# Patient Record
Sex: Female | Born: 1945 | Race: White | Hispanic: No | Marital: Married | Smoking: Never smoker
Health system: Southern US, Community
[De-identification: ages and names within clinical notes are randomized; demographics above are authoritative.]

## PROBLEM LIST (undated history)

## (undated) DIAGNOSIS — R059 Cough, unspecified: Secondary | ICD-10-CM

## (undated) DIAGNOSIS — F32A Depression, unspecified: Secondary | ICD-10-CM

## (undated) DIAGNOSIS — IMO0001 Reserved for inherently not codable concepts without codable children: Secondary | ICD-10-CM

## (undated) DIAGNOSIS — J45909 Unspecified asthma, uncomplicated: Secondary | ICD-10-CM

## (undated) DIAGNOSIS — R05 Cough: Secondary | ICD-10-CM

## (undated) DIAGNOSIS — I10 Essential (primary) hypertension: Secondary | ICD-10-CM

## (undated) DIAGNOSIS — R51 Headache: Secondary | ICD-10-CM

## (undated) DIAGNOSIS — E7800 Pure hypercholesterolemia, unspecified: Secondary | ICD-10-CM

## (undated) DIAGNOSIS — C801 Malignant (primary) neoplasm, unspecified: Secondary | ICD-10-CM

## (undated) DIAGNOSIS — G5600 Carpal tunnel syndrome, unspecified upper limb: Secondary | ICD-10-CM

## (undated) DIAGNOSIS — D72819 Decreased white blood cell count, unspecified: Secondary | ICD-10-CM

## (undated) DIAGNOSIS — F329 Major depressive disorder, single episode, unspecified: Secondary | ICD-10-CM

## (undated) DIAGNOSIS — N200 Calculus of kidney: Secondary | ICD-10-CM

## (undated) DIAGNOSIS — R519 Headache, unspecified: Secondary | ICD-10-CM

## (undated) DIAGNOSIS — K219 Gastro-esophageal reflux disease without esophagitis: Secondary | ICD-10-CM

## (undated) DIAGNOSIS — J3089 Other allergic rhinitis: Secondary | ICD-10-CM

## (undated) HISTORY — PX: SHOULDER SURGERY: SHX246

---

## 1989-06-06 HISTORY — PX: KNEE ARTHROSCOPY: SUR90

## 2001-11-04 HISTORY — PX: LESION EXCISION: SHX5167

## 2002-10-05 HISTORY — PX: LAPAROSCOPIC NISSEN FUNDOPLICATION: SHX1932

## 2003-07-25 ENCOUNTER — Other Ambulatory Visit: Payer: Self-pay

## 2004-04-12 ENCOUNTER — Ambulatory Visit: Payer: Self-pay | Admitting: Unknown Physician Specialty

## 2004-11-25 ENCOUNTER — Ambulatory Visit: Payer: Self-pay | Admitting: Internal Medicine

## 2005-04-01 ENCOUNTER — Ambulatory Visit: Payer: Self-pay | Admitting: Gastroenterology

## 2006-01-09 ENCOUNTER — Ambulatory Visit: Payer: Self-pay | Admitting: Internal Medicine

## 2007-01-11 ENCOUNTER — Ambulatory Visit: Payer: Self-pay | Admitting: Internal Medicine

## 2008-01-14 ENCOUNTER — Ambulatory Visit: Payer: Self-pay | Admitting: Internal Medicine

## 2009-01-15 ENCOUNTER — Ambulatory Visit: Payer: Self-pay | Admitting: Internal Medicine

## 2010-01-21 ENCOUNTER — Ambulatory Visit: Payer: Self-pay | Admitting: Internal Medicine

## 2011-02-01 ENCOUNTER — Ambulatory Visit: Payer: Self-pay | Admitting: Internal Medicine

## 2012-02-02 ENCOUNTER — Ambulatory Visit: Payer: Self-pay | Admitting: Internal Medicine

## 2012-08-13 ENCOUNTER — Ambulatory Visit: Payer: Self-pay | Admitting: Internal Medicine

## 2013-02-05 ENCOUNTER — Ambulatory Visit: Payer: Self-pay | Admitting: Internal Medicine

## 2014-02-12 ENCOUNTER — Ambulatory Visit: Payer: Self-pay | Admitting: Internal Medicine

## 2015-01-12 ENCOUNTER — Other Ambulatory Visit: Payer: Self-pay | Admitting: Internal Medicine

## 2015-01-12 DIAGNOSIS — Z1231 Encounter for screening mammogram for malignant neoplasm of breast: Secondary | ICD-10-CM

## 2015-01-27 ENCOUNTER — Ambulatory Visit: Payer: Self-pay

## 2015-02-17 ENCOUNTER — Ambulatory Visit: Payer: Self-pay

## 2015-02-18 ENCOUNTER — Ambulatory Visit
Admission: RE | Admit: 2015-02-18 | Discharge: 2015-02-18 | Disposition: A | Discharge status: Home / Self Care | Payer: Medicare Other | Source: Ambulatory Visit | Attending: Internal Medicine | Admitting: Internal Medicine

## 2015-02-18 DIAGNOSIS — Z1231 Encounter for screening mammogram for malignant neoplasm of breast: Secondary | ICD-10-CM | POA: Diagnosis not present

## 2015-02-18 HISTORY — DX: Malignant (primary) neoplasm, unspecified: C80.1

## 2015-02-27 ENCOUNTER — Encounter: Payer: Self-pay | Admitting: *Deleted

## 2015-02-27 NOTE — Discharge Instructions (Signed)

## 2015-02-27 NOTE — Anesthesia Preprocedure Evaluation (Addendum)
Anesthesia Evaluation  Patient identified by MRN, date of birth, ID band  Airway Mallampati: II  TM Distance: >3 FB Neck ROM: Full    Dental   Pulmonary asthma ,           Cardiovascular hypertension, Pt. on medications      Neuro/Psych    GI/Hepatic GERD  Controlled,  Endo/Other    Renal/GU      Musculoskeletal   Abdominal   Peds  Hematology   Anesthesia Other Findings   Reproductive/Obstetrics                            Anesthesia Physical Anesthesia Plan  ASA: II  Anesthesia Plan: MAC   Post-op Pain Management:    Induction:   Airway Management Planned:   Additional Equipment:   Intra-op Plan:   Post-operative Plan:   Informed Consent: I have reviewed the patients History and Physical, chart, labs and discussed the procedure including the risks, benefits and alternatives for the proposed anesthesia with the patient or authorized representative who has indicated his/her understanding and acceptance.     Plan Discussed with: CRNA  Anesthesia Plan Comments:         Anesthesia Quick Evaluation

## 2015-03-03 ENCOUNTER — Encounter: Payer: Self-pay | Admitting: *Deleted

## 2015-03-03 ENCOUNTER — Ambulatory Visit: Payer: Medicare Other | Admitting: Anesthesiology

## 2015-03-03 ENCOUNTER — Ambulatory Visit
Admission: RE | Admit: 2015-03-03 | Discharge: 2015-03-03 | Disposition: A | Discharge status: Home / Self Care | Payer: Medicare Other | Source: Ambulatory Visit | Attending: Gastroenterology | Admitting: Gastroenterology

## 2015-03-03 ENCOUNTER — Encounter
Admission: RE | Disposition: A | Discharge status: Home / Self Care | Payer: Self-pay | Source: Ambulatory Visit | Attending: Gastroenterology

## 2015-03-03 DIAGNOSIS — Z9889 Other specified postprocedural states: Secondary | ICD-10-CM | POA: Diagnosis not present

## 2015-03-03 DIAGNOSIS — J45909 Unspecified asthma, uncomplicated: Secondary | ICD-10-CM | POA: Diagnosis not present

## 2015-03-03 DIAGNOSIS — Z86010 Personal history of colonic polyps: Secondary | ICD-10-CM | POA: Insufficient documentation

## 2015-03-03 DIAGNOSIS — Z87442 Personal history of urinary calculi: Secondary | ICD-10-CM | POA: Diagnosis not present

## 2015-03-03 DIAGNOSIS — F329 Major depressive disorder, single episode, unspecified: Secondary | ICD-10-CM | POA: Insufficient documentation

## 2015-03-03 DIAGNOSIS — Z8249 Family history of ischemic heart disease and other diseases of the circulatory system: Secondary | ICD-10-CM | POA: Diagnosis not present

## 2015-03-03 DIAGNOSIS — Z79899 Other long term (current) drug therapy: Secondary | ICD-10-CM | POA: Insufficient documentation

## 2015-03-03 DIAGNOSIS — K580 Irritable bowel syndrome with diarrhea: Secondary | ICD-10-CM | POA: Insufficient documentation

## 2015-03-03 DIAGNOSIS — Z820 Family history of epilepsy and other diseases of the nervous system: Secondary | ICD-10-CM | POA: Insufficient documentation

## 2015-03-03 DIAGNOSIS — Z833 Family history of diabetes mellitus: Secondary | ICD-10-CM | POA: Insufficient documentation

## 2015-03-03 DIAGNOSIS — D72819 Decreased white blood cell count, unspecified: Secondary | ICD-10-CM | POA: Insufficient documentation

## 2015-03-03 DIAGNOSIS — R1310 Dysphagia, unspecified: Secondary | ICD-10-CM | POA: Diagnosis present

## 2015-03-03 DIAGNOSIS — Z803 Family history of malignant neoplasm of breast: Secondary | ICD-10-CM | POA: Insufficient documentation

## 2015-03-03 DIAGNOSIS — E780 Pure hypercholesterolemia: Secondary | ICD-10-CM | POA: Diagnosis not present

## 2015-03-03 DIAGNOSIS — I10 Essential (primary) hypertension: Secondary | ICD-10-CM | POA: Insufficient documentation

## 2015-03-03 DIAGNOSIS — K210 Gastro-esophageal reflux disease with esophagitis: Secondary | ICD-10-CM | POA: Diagnosis not present

## 2015-03-03 DIAGNOSIS — Z7951 Long term (current) use of inhaled steroids: Secondary | ICD-10-CM | POA: Insufficient documentation

## 2015-03-03 HISTORY — DX: Cough, unspecified: R05.9

## 2015-03-03 HISTORY — PX: ESOPHAGOGASTRODUODENOSCOPY: SHX5428

## 2015-03-03 HISTORY — PX: COLONOSCOPY: SHX5424

## 2015-03-03 HISTORY — DX: Essential (primary) hypertension: I10

## 2015-03-03 HISTORY — DX: Carpal tunnel syndrome, unspecified upper limb: G56.00

## 2015-03-03 HISTORY — DX: Decreased white blood cell count, unspecified: D72.819

## 2015-03-03 HISTORY — DX: Gastro-esophageal reflux disease without esophagitis: K21.9

## 2015-03-03 HISTORY — DX: Reserved for inherently not codable concepts without codable children: IMO0001

## 2015-03-03 HISTORY — DX: Major depressive disorder, single episode, unspecified: F32.9

## 2015-03-03 HISTORY — DX: Pure hypercholesterolemia, unspecified: E78.00

## 2015-03-03 HISTORY — DX: Unspecified asthma, uncomplicated: J45.909

## 2015-03-03 HISTORY — DX: Other allergic rhinitis: J30.89

## 2015-03-03 HISTORY — DX: Calculus of kidney: N20.0

## 2015-03-03 HISTORY — DX: Headache: R51

## 2015-03-03 HISTORY — DX: Depression, unspecified: F32.A

## 2015-03-03 HISTORY — DX: Cough: R05

## 2015-03-03 HISTORY — DX: Headache, unspecified: R51.9

## 2015-03-03 SURGERY — COLONOSCOPY
Anesthesia: Monitor Anesthesia Care | Wound class: Contaminated

## 2015-03-03 MED ORDER — GLYCOPYRROLATE 0.2 MG/ML IJ SOLN
INTRAMUSCULAR | Status: DC | PRN
  Administered 2015-03-03: 0.2 mg via INTRAVENOUS

## 2015-03-03 MED ORDER — PROPOFOL 10 MG/ML IV BOLUS
INTRAVENOUS | Status: DC | PRN
  Administered 2015-03-03 (×3): 20 mg via INTRAVENOUS
  Administered 2015-03-03: 30 mg via INTRAVENOUS
  Administered 2015-03-03: 50 mg via INTRAVENOUS
  Administered 2015-03-03 (×3): 20 mg via INTRAVENOUS
  Administered 2015-03-03: 30 mg via INTRAVENOUS
  Administered 2015-03-03: 10 mg via INTRAVENOUS
  Administered 2015-03-03 (×3): 20 mg via INTRAVENOUS
  Administered 2015-03-03: 30 mg via INTRAVENOUS

## 2015-03-03 MED ORDER — LIDOCAINE HCL (CARDIAC) 20 MG/ML IV SOLN
INTRAVENOUS | Status: DC | PRN
  Administered 2015-03-03: 50 mg via INTRAVENOUS

## 2015-03-03 MED ORDER — SIMETHICONE 40 MG/0.6ML PO SUSP
ORAL | Status: DC | PRN
  Administered 2015-03-03: 10:00:00

## 2015-03-03 MED ORDER — LACTATED RINGERS IV SOLN
INTRAVENOUS | Status: DC
  Administered 2015-03-03: 09:00:00 via INTRAVENOUS

## 2015-03-03 SURGICAL SUPPLY — 41 items
BALLN DILATOR 10-12 8 (BALLOONS) ×2
BALLN DILATOR 12-15 8 (BALLOONS)
BALLN DILATOR 15-18 8 (BALLOONS)
BALLN DILATOR CRE 0-12 8 (BALLOONS) ×1
BALLN DILATOR ESOPH 8 10 CRE (MISCELLANEOUS) IMPLANT
BALLOON DILATOR 12-15 8 (BALLOONS) IMPLANT
BALLOON DILATOR 15-18 8 (BALLOONS) IMPLANT
BALLOON DILATOR CRE 0-12 8 (BALLOONS) ×1 IMPLANT
BLOCK BITE 60FR ADLT L/F GRN (MISCELLANEOUS) ×3 IMPLANT
CANISTER SUCT 1200ML W/VALVE (MISCELLANEOUS) ×3 IMPLANT
FCP ESCP3.2XJMB 240X2.8X (MISCELLANEOUS)
FORCEPS BIOP RAD 4 LRG CAP 4 (CUTTING FORCEPS) IMPLANT
FORCEPS BIOP RJ4 240 W/NDL (MISCELLANEOUS)
FORCEPS ESCP3.2XJMB 240X2.8X (MISCELLANEOUS) IMPLANT
GOWN CVR UNV OPN BCK APRN NK (MISCELLANEOUS) ×1 IMPLANT
GOWN ISOL THUMB LOOP REG UNIV (MISCELLANEOUS) ×2
GOWN STRL REUS W/ TWL LRG LVL3 (GOWN DISPOSABLE) ×1 IMPLANT
GOWN STRL REUS W/TWL LRG LVL3 (GOWN DISPOSABLE) ×2
HEMOCLIP INSTINCT (CLIP) IMPLANT
INJECTOR VARIJECT VIN23 (MISCELLANEOUS) IMPLANT
KIT CO2 TUBING (TUBING) IMPLANT
KIT DEFENDO VALVE AND CONN (KITS) IMPLANT
KIT ENDO PROCEDURE OLY (KITS) ×3 IMPLANT
LIGATOR MULTIBAND 6SHOOTER MBL (MISCELLANEOUS) IMPLANT
MARKER SPOT ENDO TATTOO 5ML (MISCELLANEOUS) IMPLANT
PAD GROUND ADULT SPLIT (MISCELLANEOUS) IMPLANT
SNARE SHORT THROW 13M SML OVAL (MISCELLANEOUS) IMPLANT
SNARE SHORT THROW 30M LRG OVAL (MISCELLANEOUS) IMPLANT
SPOT EX ENDOSCOPIC TATTOO (MISCELLANEOUS)
SUCTION POLY TRAP 4CHAMBER (MISCELLANEOUS) IMPLANT
SYR INFLATION 60ML (SYRINGE) ×3 IMPLANT
TRAP SUCTION POLY (MISCELLANEOUS) IMPLANT
TUBING CONN 6MMX3.1M (TUBING)
TUBING SUCTION CONN 0.25 STRL (TUBING) IMPLANT
UNDERPAD 30X60 958B10 (PK) (MISCELLANEOUS) IMPLANT
VALVE BIOPSY ENDO (VALVE) IMPLANT
VARIJECT INJECTOR VIN23 (MISCELLANEOUS)
WATER AUXILLARY (MISCELLANEOUS) IMPLANT
WATER STERILE IRR 250ML POUR (IV SOLUTION) ×3 IMPLANT
WATER STERILE IRR 500ML POUR (IV SOLUTION) IMPLANT
WIRE CRE 18-20MM 8CM F G (MISCELLANEOUS) IMPLANT

## 2015-03-03 NOTE — H&P (Signed)
  Date of Initial H&P: 02/05/2015  History reviewed, patient examined, no change in status, stable for surgery.

## 2015-03-03 NOTE — Op Note (Signed)
[facility] Patient Name: Diamond Odonnell Procedure Date: 03/03/2015 9:36 AM MRN: [id number] Account #: [id number] Date of Birth: Sep 26, 1945 Admit Type: Outpatient Age: 69 Room: [facility] OR ROOM 01 Gender: Female Note Status: Finalized Procedure:         Colonoscopy Indications:       Screening for colorectal malignant neoplasm Providers:         Lupita Dawn. Candace Cruise, MD Medicines:         Monitored Anesthesia Care Complications:     No immediate complications. Procedure:         Pre-Anesthesia Assessment:                    - Prior to the procedure, a History and Physical was                     performed, and patient medications, allergies and                     sensitivities were reviewed. The patient's tolerance of                     previous anesthesia was reviewed.                    - The risks and benefits of the procedure and the sedation                     options and risks were discussed with the patient. All                     questions were answered and informed consent was obtained.                    - After reviewing the risks and benefits, the patient was                     deemed in satisfactory condition to undergo the procedure.                    After obtaining informed consent, the colonoscope was                     passed under direct vision. Throughout the procedure, the                     patient's blood pressure, pulse, and oxygen saturations                     were monitored continuously. The Olympus CF H180AL                     colonoscope (S#: [id number]) was introduced through the anus                     and advanced to the the cecum, identified by appendiceal                     orifice and ileocecal valve. The colonoscopy was performed                     without difficulty. The patient tolerated the procedure                     well. Findings:  The colon (entire examined portion) appeared normal. Impression:         - The entire examined colon is normal.                    - No specimens collected. Recommendation:    - Discharge patient to home.                    - Repeat colonoscopy in 10 years for surveillance.                    - The findings and recommendations were discussed with the                     patient. Procedure Code(s): --- Professional ---                    [phone], Colonoscopy, flexible; diagnostic, including                     collection of specimen(s) by brushing or washing, when                     performed (separate procedure) Diagnosis Code(s): --- Professional ---                    Z12.11, Encounter for screening for malignant neoplasm of                     colon CPT copyright 2014 American Medical Association. All rights reserved. The codes documented in this report are preliminary and upon coder review may  be revised to meet current compliance requirements. Hulen Luster, MD 03/03/2015 10:13:37 AM This report has been signed electronically. Number of Addenda: 0 Note Initiated On: 03/03/2015 9:36 AM Scope Withdrawal Time: 0 hours 7 minutes 35 seconds  Total Procedure Duration: 0 hours 12 minutes 6 seconds       [facility]

## 2015-03-03 NOTE — Transfer of Care (Signed)
Immediate Anesthesia Transfer of Care Note  Patient: Diamond Odonnell  Procedure(s) Performed: Procedure(s): COLONOSCOPY (N/A) ESOPHAGOGASTRODUODENOSCOPY (EGD) with dialation (N/A)  Patient Location: PACU  Anesthesia Type: MAC  Level of Consciousness: awake, alert  and patient cooperative  Airway and Oxygen Therapy: Patient Spontanous Breathing and Patient connected to supplemental oxygen  Post-op Assessment: Post-op Vital signs reviewed, Patient's Cardiovascular Status Stable, Respiratory Function Stable, Patent Airway and No signs of Nausea or vomiting  Post-op Vital Signs: Reviewed and stable  Complications: No apparent anesthesia complications

## 2015-03-03 NOTE — Anesthesia Procedure Notes (Signed)
Procedure Name: MAC Performed by: Carlene Bickley Pre-anesthesia Checklist: Patient identified, Emergency Drugs available, Suction available, Timeout performed and Patient being monitored Patient Re-evaluated:Patient Re-evaluated prior to inductionOxygen Delivery Method: Nasal cannula Placement Confirmation: positive ETCO2       

## 2015-03-03 NOTE — Anesthesia Postprocedure Evaluation (Signed)
  Anesthesia Post-op Note  Patient: Diamond Odonnell  Procedure(s) Performed: Procedure(s): COLONOSCOPY (N/A) ESOPHAGOGASTRODUODENOSCOPY (EGD) with dialation (N/A)  Anesthesia type:MAC  Patient location: PACU  Post pain: Pain level controlled  Post assessment: Post-op Vital signs reviewed, Patient's Cardiovascular Status Stable, Respiratory Function Stable, Patent Airway and No signs of Nausea or vomiting  Post vital signs: Reviewed and stable  Last Vitals:  Filed Vitals:   03/03/15 1029  BP: 125/73  Pulse: 97  Temp:   Resp: 15    Level of consciousness: awake, alert  and patient cooperative  Complications: No apparent anesthesia complications

## 2015-03-03 NOTE — Op Note (Signed)
[facility] Patient Name: Diamond Odonnell Procedure Date: 03/03/2015 9:37 AM MRN: [id number] Account #: [id number] Date of Birth: September 10, 1945 Admit Type: Outpatient Age: 69 Room: [facility] OR ROOM 01 Gender: Female Note Status: Finalized Procedure:         Upper GI endoscopy Indications:       Dysphagia Providers:         Lupita Dawn. Candace Cruise, MD Referring MD:      Christena Flake. Raechel Ache, MD (Referring MD) Medicines:         Monitored Anesthesia Care Complications:     No immediate complications. Procedure:         Pre-Anesthesia Assessment:                    - Prior to the procedure, a History and Physical was                     performed, and patient medications, allergies and                     sensitivities were reviewed. The patient's tolerance of                     previous anesthesia was reviewed.                    - The risks and benefits of the procedure and the sedation                     options and risks were discussed with the patient. All                     questions were answered and informed consent was obtained.                    - After reviewing the risks and benefits, the patient was                     deemed in satisfactory condition to undergo the procedure.                    After obtaining informed consent, the endoscope was passed                     under direct vision. Throughout the procedure, the                     patient's blood pressure, pulse, and oxygen saturations                     were monitored continuously. The Olympus GIF H180J                     endoscope (S#: [id number]) was introduced through the mouth,                     and advanced to the second part of duodenum. The upper GI                     endoscopy was accomplished without difficulty. The patient                     tolerated the procedure well. Findings:      LA Grade A (one or more mucosal breaks less than 5  mm, not extending       between tops of 2  mucosal folds) esophagitis was found at the       gastroesophageal junction. A TTS dilator was passed through the scope.       Dilation with a 15-16.5-18 mm x 8 cm CRE balloon (to a maximum balloon       size of 18 mm) dilator was performed.      The exam was otherwise without abnormality.      The exam of the stomach was otherwise normal.      Intact Nissen fundoplication.      The examined duodenum was normal. Impression:        - LA Grade A reflux esophagitis. Dilated.                    - The examination was otherwise normal.                    - Normal examined duodenum.                    - No specimens collected. Recommendation:    - Discharge patient to home.                    - Observe patient's clinical course.                    - Use a proton pump inhibitor PO daily daily. Procedure Code(s): --- Professional ---                    [phone], Esophagogastroduodenoscopy, flexible, transoral;                     with transendoscopic balloon dilation of esophagus (less                     than 30 mm diameter) Diagnosis Code(s): --- Professional ---                    K21.0, Gastro-esophageal reflux disease with esophagitis                    R13.10, Dysphagia, unspecified CPT copyright 2014 American Medical Association. All rights reserved. The codes documented in this report are preliminary and upon coder review may  be revised to meet current compliance requirements. Hulen Luster, MD 03/03/2015 9:57:22 AM This report has been signed electronically. Number of Addenda: 0 Note Initiated On: 03/03/2015 9:37 AM      [facility]

## 2015-12-29 ENCOUNTER — Other Ambulatory Visit: Payer: Self-pay | Admitting: Nurse Practitioner

## 2015-12-29 DIAGNOSIS — K2100 Gastro-esophageal reflux disease with esophagitis, without bleeding: Secondary | ICD-10-CM

## 2016-01-01 ENCOUNTER — Ambulatory Visit
Admission: RE | Admit: 2016-01-01 | Discharge: 2016-01-01 | Disposition: A | Discharge status: Home / Self Care | Payer: Medicare Other | Source: Ambulatory Visit | Attending: Nurse Practitioner | Admitting: Nurse Practitioner

## 2016-01-01 DIAGNOSIS — K582 Mixed irritable bowel syndrome: Secondary | ICD-10-CM | POA: Insufficient documentation

## 2016-01-01 DIAGNOSIS — K449 Diaphragmatic hernia without obstruction or gangrene: Secondary | ICD-10-CM | POA: Insufficient documentation

## 2016-01-01 DIAGNOSIS — K222 Esophageal obstruction: Secondary | ICD-10-CM | POA: Diagnosis not present

## 2016-01-01 DIAGNOSIS — K2100 Gastro-esophageal reflux disease with esophagitis, without bleeding: Secondary | ICD-10-CM

## 2016-01-12 ENCOUNTER — Other Ambulatory Visit: Payer: Self-pay | Admitting: Internal Medicine

## 2016-01-12 DIAGNOSIS — Z1231 Encounter for screening mammogram for malignant neoplasm of breast: Secondary | ICD-10-CM

## 2017-05-23 IMAGING — RF DG UGI W/ SMALL BOWEL
15 of 17 series · 15 of 17 positions shown · non-contrast
Comparison: No recent prior .

ADDENDUM:
On further review the mild narrowing of the distal esophagus is most
likely secondary to a fundoplication defect.

EXAM:
UPPER GI SERIES WITH SMALL BOWEL FOLLOW-THROUGH
FLUOROSCOPY TIME:  Radiation Exposure Index (as provided by the
fluoroscopic device): 84.8 mGy
TECHNIQUE: Combined double contrast and single contrast upper GI series using
effervescent crystals, thick barium, and thin barium. Subsequently,
serial images of the small bowel were obtained including spot views
of the terminal ileum.

[Series 1: t abdomen supine · 0.15mm/px · 1 of 1 slices shown]
[im 1/1]
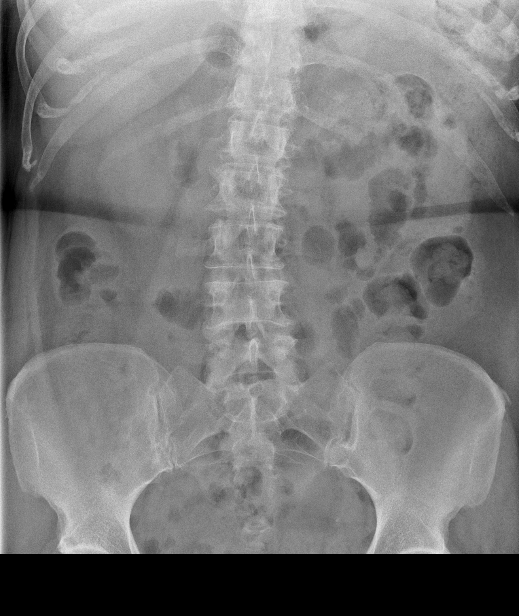

[Series 2: fluoro_barium swallow 2fps_bw · 0.17mm/px · 1 of 1 slices shown (1 of 9)]
[im 1/1]
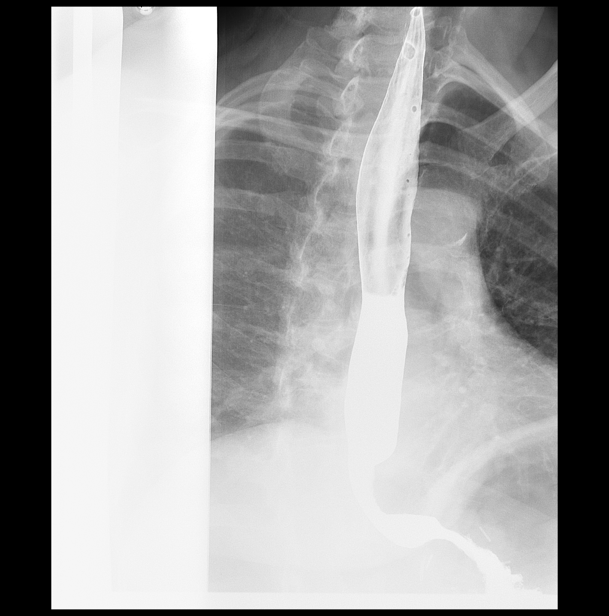

[Series 3: fluoro_barium swallow 2fps_bw · 0.17mm/px · 1 of 1 slices shown (2 of 9)]
[im 1/1]
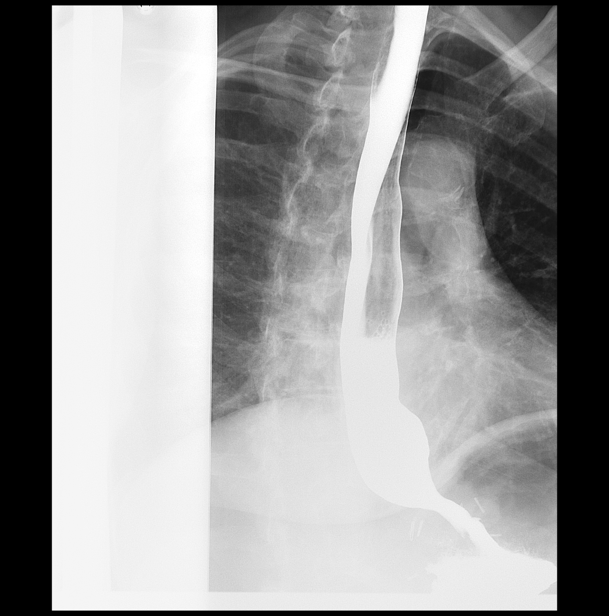

[Series 4: fluoro_barium swallow 2fps_bw · 0.19mm/px · 1 of 1 slices shown (3 of 9)]
[im 1/1]
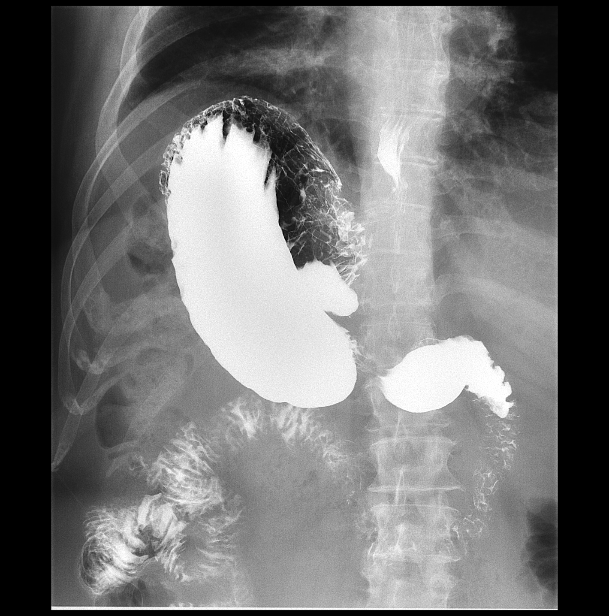

[Series 6: fluoro_barium swallow 2fps_bw · 0.19mm/px · 1 of 1 slices shown (4 of 9)]
[im 1/1]
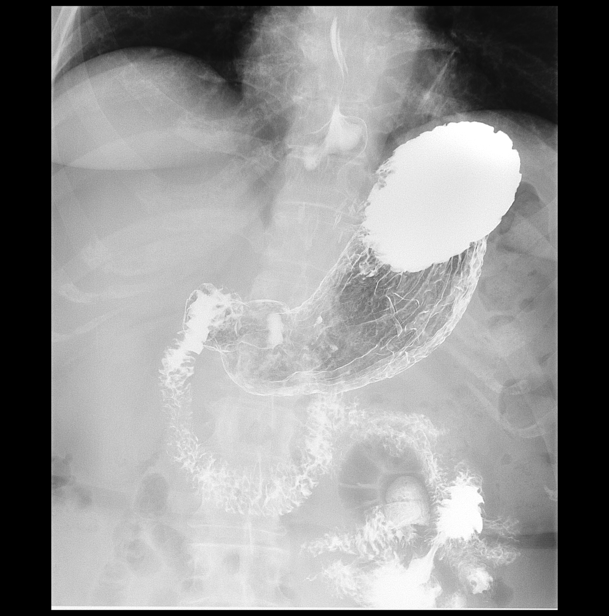

[Series 7: fluoro_barium swallow 2fps_bw · 0.19mm/px · 1 of 1 slices shown (5 of 9)]
[im 1/1]
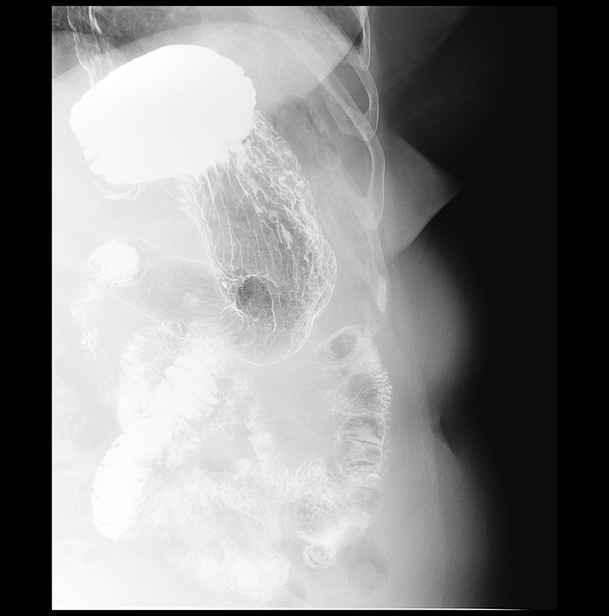

[Series 8: fluoro_barium swallow 2fps_bw · 0.19mm/px · 1 of 1 slices shown (6 of 9)]
[im 1/1]
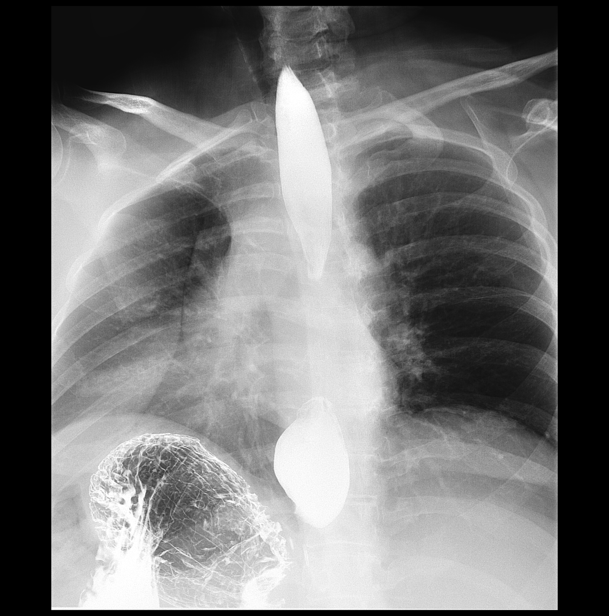

[Series 9: fluoro_barium swallow 2fps_bw · 0.19mm/px · 1 of 1 slices shown (7 of 9)]
[im 1/1]
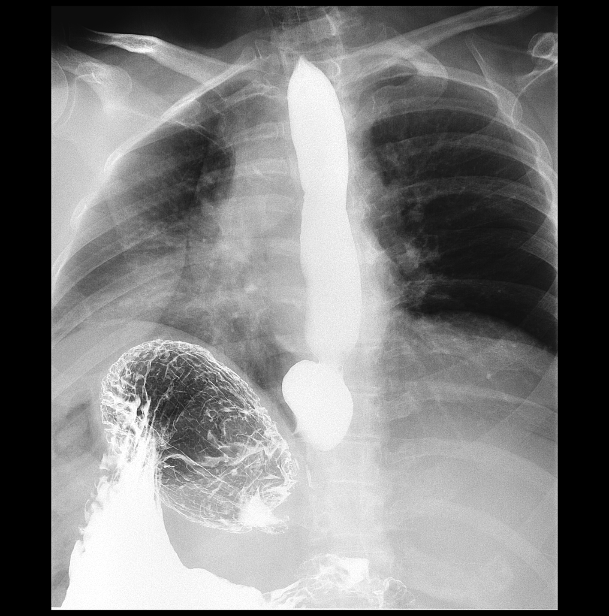

[Series 10: fluoro_barium swallow 2fps_bw · 0.19mm/px · 1 of 1 slices shown (8 of 9)]
[im 1/1]
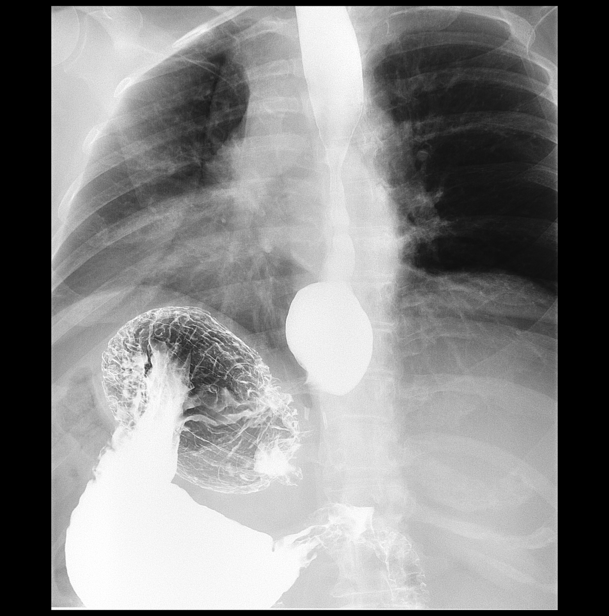

[Series 11: fluoro_barium swallow 2fps_bw · 0.19mm/px · 1 of 1 slices shown (9 of 9)]
[im 1/1]
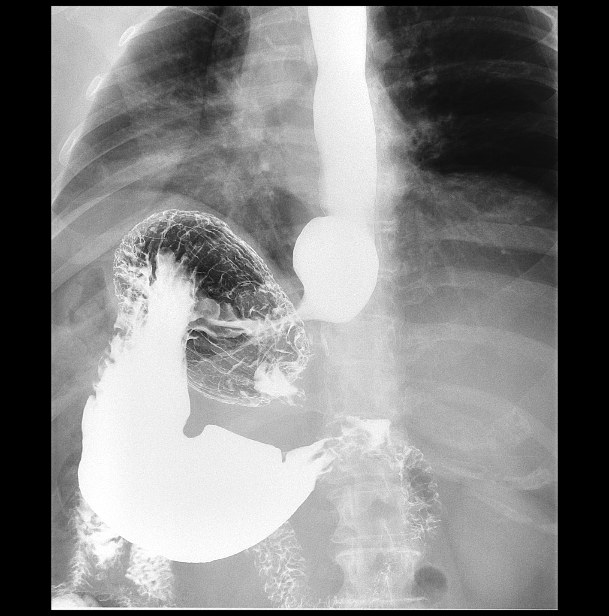

[Series 12: t abdomen barium ap · 0.15mm/px · 1 of 1 slices shown]
[im 1/1]
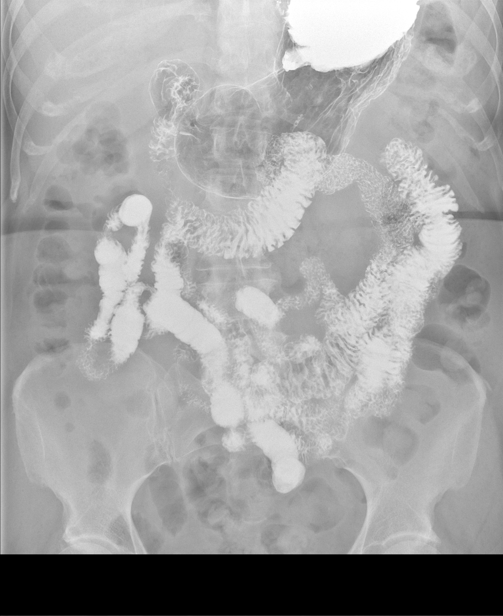

[Series 14: fluoro_barium singleshot_bw · 0.19mm/px · 1 of 1 slices shown (1 of 4)]
[im 1/1]
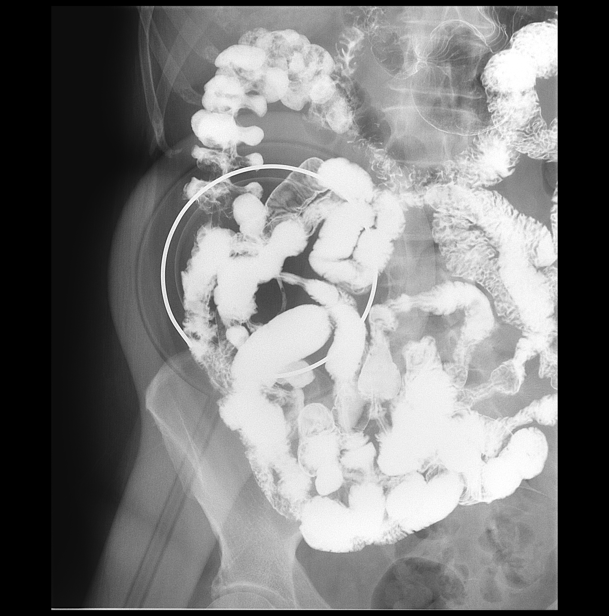

[Series 15: fluoro_barium singleshot_bw · 0.19mm/px · 1 of 1 slices shown (2 of 4)]
[im 1/1]
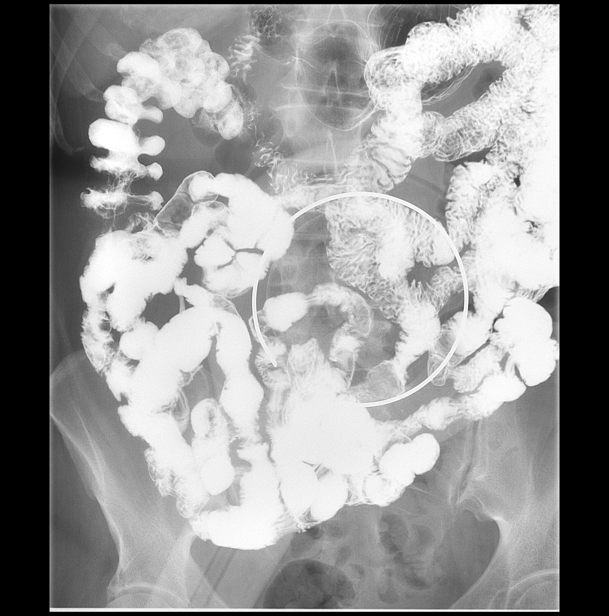

[Series 16: fluoro_barium singleshot_bw · 0.19mm/px · 1 of 1 slices shown (3 of 4)]
[im 1/1]
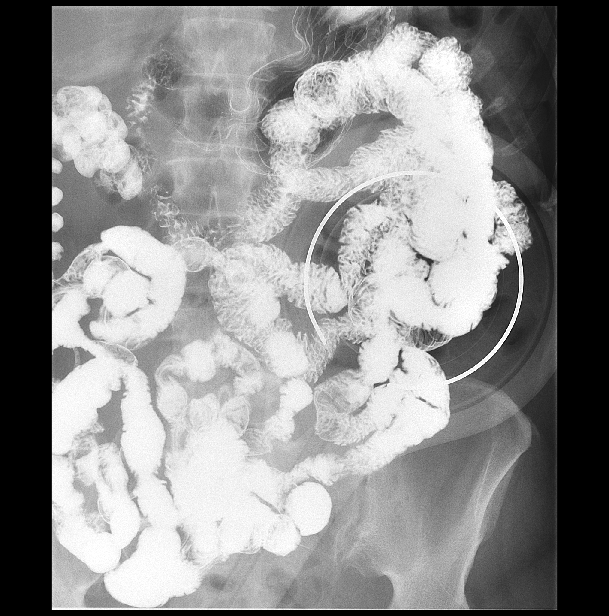

[Series 17: fluoro_barium singleshot_bw · 0.19mm/px · 1 of 1 slices shown (4 of 4)]
[im 1/1]
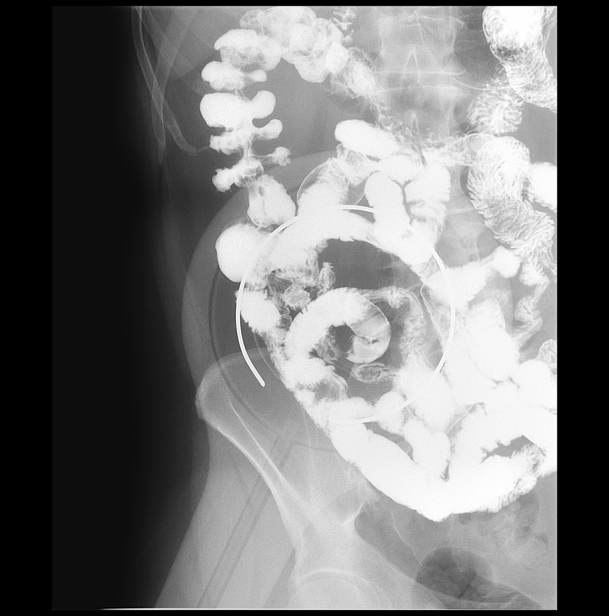

[15 of 17 positions shown; findings below may reference images not displayed]

FINDINGS: Small sliding hiatal hernia is present. Mild narrowing of the distal
esophagus at the gastroesophageal junction is noted. This is
pliable. No mass noted. No obstructing esophageal lesions noted. No
reflux. Stomach and duodenum appear normal. C-loop appears normal.
Small-bowel full-thickness wall. Small-bowel fold caliber normal.
Terminal ileum appears normal.
IMPRESSION: 1. Small sliding hiatal hernia. No prominent reflux. Mild narrowing
of the distal esophagus at the gastroesophageal junction is noted.
This is pliable. This could be secondary to inflammatory change or
prior stricture. No fixed stricture or mass lesion noted. No
obstructing esophageal lesions noted.

2.  Stomach and small bowel appear normal .

## 2017-06-14 ENCOUNTER — Other Ambulatory Visit: Payer: Self-pay | Admitting: Gastroenterology

## 2017-06-14 DIAGNOSIS — R1032 Left lower quadrant pain: Secondary | ICD-10-CM

## 2017-06-14 DIAGNOSIS — R1031 Right lower quadrant pain: Secondary | ICD-10-CM

## 2017-06-14 DIAGNOSIS — R1030 Lower abdominal pain, unspecified: Secondary | ICD-10-CM

## 2017-06-20 ENCOUNTER — Ambulatory Visit
Admission: RE | Admit: 2017-06-20 | Discharge: 2017-06-20 | Disposition: A | Discharge status: Home / Self Care | Payer: Medicare Other | Source: Ambulatory Visit | Attending: Gastroenterology | Admitting: Gastroenterology

## 2017-06-20 DIAGNOSIS — N939 Abnormal uterine and vaginal bleeding, unspecified: Secondary | ICD-10-CM | POA: Insufficient documentation

## 2017-06-20 DIAGNOSIS — R1030 Lower abdominal pain, unspecified: Secondary | ICD-10-CM | POA: Insufficient documentation

## 2017-06-20 DIAGNOSIS — R1031 Right lower quadrant pain: Secondary | ICD-10-CM

## 2017-06-20 DIAGNOSIS — R1032 Left lower quadrant pain: Secondary | ICD-10-CM

## 2017-07-17 ENCOUNTER — Other Ambulatory Visit: Payer: Self-pay | Admitting: Internal Medicine

## 2017-07-17 DIAGNOSIS — Z1231 Encounter for screening mammogram for malignant neoplasm of breast: Secondary | ICD-10-CM

## 2017-07-25 ENCOUNTER — Ambulatory Visit
Admission: RE | Admit: 2017-07-25 | Discharge: 2017-07-25 | Disposition: A | Discharge status: Home / Self Care | Payer: Medicare Other | Source: Ambulatory Visit | Attending: Internal Medicine | Admitting: Internal Medicine

## 2017-07-25 DIAGNOSIS — Z1231 Encounter for screening mammogram for malignant neoplasm of breast: Secondary | ICD-10-CM | POA: Insufficient documentation

## 2017-12-19 ENCOUNTER — Other Ambulatory Visit
Admission: RE | Admit: 2017-12-19 | Discharge: 2017-12-19 | Disposition: A | Discharge status: Home / Self Care | Payer: Medicare Other | Source: Ambulatory Visit | Attending: Gastroenterology | Admitting: Gastroenterology

## 2017-12-19 DIAGNOSIS — K582 Mixed irritable bowel syndrome: Secondary | ICD-10-CM | POA: Diagnosis present

## 2017-12-25 LAB — MISCELLANEOUS TEST

## 2018-04-23 ENCOUNTER — Ambulatory Visit: Payer: Medicare Other | Admitting: Anesthesiology

## 2018-04-23 ENCOUNTER — Ambulatory Visit
Admission: RE | Admit: 2018-04-23 | Discharge: 2018-04-23 | Disposition: A | Discharge status: Home / Self Care | Payer: Medicare Other | Source: Ambulatory Visit | Attending: Gastroenterology | Admitting: Gastroenterology

## 2018-04-23 ENCOUNTER — Encounter: Payer: Self-pay | Admitting: Anesthesiology

## 2018-04-23 ENCOUNTER — Encounter
Admission: RE | Disposition: A | Discharge status: Home / Self Care | Payer: Self-pay | Source: Ambulatory Visit | Attending: Gastroenterology

## 2018-04-23 DIAGNOSIS — F329 Major depressive disorder, single episode, unspecified: Secondary | ICD-10-CM | POA: Insufficient documentation

## 2018-04-23 DIAGNOSIS — K224 Dyskinesia of esophagus: Secondary | ICD-10-CM | POA: Insufficient documentation

## 2018-04-23 DIAGNOSIS — I10 Essential (primary) hypertension: Secondary | ICD-10-CM | POA: Insufficient documentation

## 2018-04-23 DIAGNOSIS — K2970 Gastritis, unspecified, without bleeding: Secondary | ICD-10-CM | POA: Diagnosis not present

## 2018-04-23 DIAGNOSIS — D124 Benign neoplasm of descending colon: Secondary | ICD-10-CM | POA: Insufficient documentation

## 2018-04-23 DIAGNOSIS — J45909 Unspecified asthma, uncomplicated: Secondary | ICD-10-CM | POA: Insufficient documentation

## 2018-04-23 DIAGNOSIS — K319 Disease of stomach and duodenum, unspecified: Secondary | ICD-10-CM | POA: Insufficient documentation

## 2018-04-23 DIAGNOSIS — K529 Noninfective gastroenteritis and colitis, unspecified: Secondary | ICD-10-CM | POA: Diagnosis not present

## 2018-04-23 DIAGNOSIS — E7800 Pure hypercholesterolemia, unspecified: Secondary | ICD-10-CM | POA: Insufficient documentation

## 2018-04-23 DIAGNOSIS — D125 Benign neoplasm of sigmoid colon: Secondary | ICD-10-CM | POA: Insufficient documentation

## 2018-04-23 DIAGNOSIS — K5730 Diverticulosis of large intestine without perforation or abscess without bleeding: Secondary | ICD-10-CM | POA: Insufficient documentation

## 2018-04-23 DIAGNOSIS — K644 Residual hemorrhoidal skin tags: Secondary | ICD-10-CM | POA: Insufficient documentation

## 2018-04-23 DIAGNOSIS — K640 First degree hemorrhoids: Secondary | ICD-10-CM | POA: Diagnosis not present

## 2018-04-23 DIAGNOSIS — K210 Gastro-esophageal reflux disease with esophagitis: Secondary | ICD-10-CM | POA: Diagnosis not present

## 2018-04-23 HISTORY — PX: COLONOSCOPY WITH PROPOFOL: SHX5780

## 2018-04-23 HISTORY — PX: ESOPHAGOGASTRODUODENOSCOPY (EGD) WITH PROPOFOL: SHX5813

## 2018-04-23 SURGERY — ESOPHAGOGASTRODUODENOSCOPY (EGD) WITH PROPOFOL
Anesthesia: General

## 2018-04-23 MED ORDER — MIDAZOLAM HCL 2 MG/2ML IJ SOLN
INTRAMUSCULAR | Status: DC | PRN
  Administered 2018-04-23: 2 mg via INTRAVENOUS

## 2018-04-23 MED ORDER — LIDOCAINE HCL (PF) 2 % IJ SOLN
INTRAMUSCULAR | Status: AC
  Filled 2018-04-23: qty 10

## 2018-04-23 MED ORDER — LIDOCAINE HCL (PF) 1 % IJ SOLN
INTRAMUSCULAR | Status: AC
  Administered 2018-04-23: 0.3 mL via INTRADERMAL
  Filled 2018-04-23: qty 2

## 2018-04-23 MED ORDER — FENTANYL CITRATE (PF) 100 MCG/2ML IJ SOLN
INTRAMUSCULAR | Status: DC | PRN
  Administered 2018-04-23 (×2): 50 ug via INTRAVENOUS

## 2018-04-23 MED ORDER — PROPOFOL 500 MG/50ML IV EMUL
INTRAVENOUS | Status: DC | PRN
  Administered 2018-04-23: 100 ug/kg/min via INTRAVENOUS

## 2018-04-23 MED ORDER — SODIUM CHLORIDE 0.9 % IV SOLN
INTRAVENOUS | Status: DC
  Administered 2018-04-23: 1000 mL via INTRAVENOUS

## 2018-04-23 MED ORDER — MIDAZOLAM HCL 2 MG/2ML IJ SOLN
INTRAMUSCULAR | Status: AC
  Filled 2018-04-23: qty 2

## 2018-04-23 MED ORDER — LIDOCAINE HCL (PF) 1 % IJ SOLN
2.0000 mL | Freq: Once | INTRAMUSCULAR | Status: AC
  Administered 2018-04-23: 0.3 mL via INTRADERMAL

## 2018-04-23 MED ORDER — EPHEDRINE SULFATE 50 MG/ML IJ SOLN
INTRAMUSCULAR | Status: AC
  Filled 2018-04-23: qty 1

## 2018-04-23 MED ORDER — PROPOFOL 500 MG/50ML IV EMUL
INTRAVENOUS | Status: AC
  Filled 2018-04-23: qty 50

## 2018-04-23 MED ORDER — EPHEDRINE SULFATE 50 MG/ML IJ SOLN
INTRAMUSCULAR | Status: DC | PRN
  Administered 2018-04-23 (×2): 5 mg via INTRAVENOUS

## 2018-04-23 MED ORDER — FENTANYL CITRATE (PF) 100 MCG/2ML IJ SOLN
INTRAMUSCULAR | Status: AC
  Filled 2018-04-23: qty 2

## 2018-04-23 MED ORDER — LIDOCAINE HCL (CARDIAC) PF 100 MG/5ML IV SOSY
PREFILLED_SYRINGE | INTRAVENOUS | Status: DC | PRN
  Administered 2018-04-23: 30 mg via INTRAVENOUS

## 2018-04-23 NOTE — Transfer of Care (Signed)
Immediate Anesthesia Transfer of Care Note  Patient: Diamond Odonnell  Procedure(s) Performed: ESOPHAGOGASTRODUODENOSCOPY (EGD) WITH PROPOFOL (N/A ) COLONOSCOPY WITH PROPOFOL (N/A )  Patient Location: PACU  Anesthesia Type:General  Level of Consciousness: awake and sedated  Airway & Oxygen Therapy: Patient Spontanous Breathing and Patient connected to nasal cannula oxygen  Post-op Assessment: Report given to RN and Post -op Vital signs reviewed and stable  Post vital signs: Reviewed and stable  Last Vitals:  Vitals Value Taken Time  BP 132/71 04/23/2018 11:50 AM  Temp 36.4 C 04/23/2018 11:49 AM  Pulse 85 04/23/2018 11:50 AM  Resp 16 04/23/2018 11:50 AM  SpO2 100 % 04/23/2018 11:50 AM  Vitals shown include unvalidated device data.  Last Pain:  Vitals:   04/23/18 1149  TempSrc: Tympanic  PainSc: 0-No pain         Complications: No apparent anesthesia complications

## 2018-04-23 NOTE — Anesthesia Procedure Notes (Addendum)
Performed by: Vaughan Sine Pre-anesthesia Checklist: Patient identified, Suction available, Emergency Drugs available, Patient being monitored and Timeout performed Patient Re-evaluated:Patient Re-evaluated prior to induction Oxygen Delivery Method: Simple face mask Preoxygenation: Pre-oxygenation with 100% oxygen Induction Type: IV induction Ventilation: Oral airway inserted - appropriate to patient size Airway Equipment and Method: Bite block Placement Confirmation: CO2 detector and positive ETCO2

## 2018-04-23 NOTE — Anesthesia Preprocedure Evaluation (Signed)
Anesthesia Evaluation  Patient identified by MRN, date of birth, ID band Patient awake    Reviewed: Allergy & Precautions, NPO status , Patient's Chart, lab work & pertinent test results  History of Anesthesia Complications Negative for: history of anesthetic complications  Airway Mallampati: III  TM Distance: >3 FB Neck ROM: Full    Dental no notable dental hx.    Pulmonary asthma , neg sleep apnea,    breath sounds clear to auscultation- rhonchi (-) wheezing      Cardiovascular hypertension, Pt. on medications (-) CAD, (-) Past MI, (-) Cardiac Stents and (-) CABG  Rhythm:Regular Rate:Normal - Systolic murmurs and - Diastolic murmurs    Neuro/Psych  Headaches, PSYCHIATRIC DISORDERS Depression    GI/Hepatic Neg liver ROS, GERD  ,  Endo/Other  negative endocrine ROSneg diabetes  Renal/GU Renal disease: hx of nephrolithiasis.     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 No date: Cough     Comment:  from asthma and GERD No date: Depression No date: Environmental and seasonal allergies No date: GERD (gastroesophageal reflux disease) No date: Headache     Comment:  from allergies No date: Hypercholesteremia No date: Hypertension No date: Nephrolithiasis No date: Shortness of breath dyspnea     Comment:  when asthma flares   Reproductive/Obstetrics                             Anesthesia Physical Anesthesia Plan  ASA: II  Anesthesia Plan: General   Post-op Pain Management:    Induction: Intravenous  PONV Risk Score and Plan: 2 and Propofol infusion  Airway Management Planned: Natural Airway  Additional  Equipment:   Intra-op Plan:   Post-operative Plan:   Informed Consent: I have reviewed the patients History and Physical, chart, labs and discussed the procedure including the risks, benefits and alternatives for the proposed anesthesia with the patient or authorized representative who has indicated his/her understanding and acceptance.   Dental advisory given  Plan Discussed with: CRNA and Anesthesiologist  Anesthesia Plan Comments:         Anesthesia Quick Evaluation

## 2018-04-23 NOTE — Anesthesia Post-op Follow-up Note (Signed)
Anesthesia QCDR form completed.        

## 2018-04-23 NOTE — Anesthesia Postprocedure Evaluation (Signed)
Anesthesia Post Note  Patient: [name]  Procedure(s) Performed: ESOPHAGOGASTRODUODENOSCOPY (EGD) WITH PROPOFOL (N/A ) COLONOSCOPY WITH PROPOFOL (N/A )  Patient location during evaluation: Endoscopy Anesthesia Type: General Level of consciousness: awake and alert and oriented Pain management: pain level controlled Vital Signs Assessment: post-procedure vital signs reviewed and stable Respiratory status: spontaneous breathing, nonlabored ventilation and respiratory function stable Cardiovascular status: blood pressure returned to baseline and stable Postop Assessment: no signs of nausea or vomiting Anesthetic complications: no     Last Vitals:  Vitals:   04/23/18 1210 04/23/18 1220  BP: (!) 154/85 (!) 154/83  Pulse: 81 76  Resp: 16 17  Temp:    SpO2: 100% 92%    Last Pain:  Vitals:   04/23/18 1220  TempSrc:   PainSc: 0-No pain                 Dakota Stangl

## 2018-04-23 NOTE — Op Note (Signed)
[facility] Patient Name: Diamond Odonnell Procedure Date: 04/23/2018 10:15 AM MRN: [id number] Account #: [id number] Date of Birth: 07-15-1945 Admit Type: Outpatient Age: 72 Room[facility] ENDO ROOM 3 Gender: Female Note Status: Finalized Procedure:            Colonoscopy Indications:          Lower abdominal pain, Chronic diarrhea Providers:            Lollie Sails, MD Referring MD:         Christena Flake. Raechel Ache, MD (Referring MD) Medicines:            Monitored Anesthesia Care Complications:        No immediate complications. Procedure:            Pre-Anesthesia Assessment:                       - ASA Grade Assessment: II - A patient with mild                        systemic disease.                       After obtaining informed consent, the colonoscope was                        passed under direct vision. Throughout the procedure,                        the patient's blood pressure, pulse, and oxygen                        saturations were monitored continuously. The                        Colonoscope was introduced through the anus and                        advanced to the the terminal ileum. The colonoscopy was                        performed with moderate difficulty due to significant                        looping. Successful completion of the procedure was                        aided by changing the patient to a supine position,                        changing the patient to a prone position, using manual                        pressure and withdrawing and reinserting the scope. The                        patient tolerated the procedure well. The quality of                        the bowel preparation was good. Findings:      Multiple small-mouthed diverticula were found in  the sigmoid colon and       distal descending colon.      Two sessile polyps were found in the distal sigmoid colon. The polyps       were 1 to 2 mm in size. These  polyps were removed with a cold biopsy       forceps. Resection and retrieval were complete.      A 2 mm polyp was found in the proximal descending colon. The polyp was       sessile. The polyp was removed with a cold biopsy forceps. Resection and       retrieval were complete.      Biopsies for histology were taken with a cold forceps from the right       colon and left colon for evaluation of microscopic colitis.      Non-bleeding external and internal hemorrhoids were found during       perianal exam and during anoscopy. The hemorrhoids were small and Grade       I (internal hemorrhoids that do not prolapse).      The digital rectal exam was normal otherwise.      The terminal ileum appeared normal. Impression:           - Diverticulosis in the sigmoid colon and in the distal                        descending colon.                       - Two 1 to 2 mm polyps in the distal sigmoid colon,                        removed with a cold biopsy forceps. Resected and                        retrieved.                       - One 2 mm polyp in the proximal descending colon,                        removed with a cold biopsy forceps. Resected and                        retrieved.                       - Non-bleeding external and internal hemorrhoids.                       - Biopsies were taken with a cold forceps from the                        right colon and left colon for evaluation of                        microscopic colitis. Recommendation:       - Discharge patient to home.                       - Await pathology results.                       -  Telephone GI clinic for pathology results in 1 week. Procedure Code(s):    --- Professional ---                       [phone], Colonoscopy, flexible; with biopsy, single or                        multiple Diagnosis Code(s):    --- Professional ---                       K64.0, First degree hemorrhoids                       D12.5, Benign neoplasm  of sigmoid colon                       D12.4, Benign neoplasm of descending colon                       R10.30, Lower abdominal pain, unspecified                       K52.9, Noninfective gastroenteritis and colitis,                        unspecified                       K57.30, Diverticulosis of large intestine without                        perforation or abscess without bleeding CPT copyright 2018 American Medical Association. All rights reserved. The codes documented in this report are preliminary and upon coder review may  be revised to meet current compliance requirements. Lollie Sails, MD 04/23/2018 11:45:42 AM This report has been signed electronically. Number of Addenda: 0 Note Initiated On: 04/23/2018 10:15 AM Scope Withdrawal Time: 0 hours 9 minutes 30 seconds  Total Procedure Duration: 0 hours 30 minutes 31 seconds       [facility]

## 2018-04-23 NOTE — H&P (Signed)
Outpatient short stay form Pre-procedure 04/23/2018 10:29 AM Lollie Sails MD  Primary Physician: Genene Churn, MD  Reason for visit: EGD and colonoscopy  History of present illness: Patient is a 72 year old female presenting today EGD and colonoscopy.  She has a history of some bilateral lower abdominal pain and diarrhea.  Question of a possible abnormal celiac panel.  She also has some dyspepsia and epigastric bloating.  There is a history of fundoplication in 4166.  Has had a chronic intermittent dysphagia since that time.  He has been taking a proton pump inhibitor but not sink with appropriate meal.  Does have some difficulty with burping and may have some amount of gas bloat syndrome.    Current Facility-Administered Medications:  .  0.9 %  sodium chloride infusion, , Intravenous, Continuous, Lollie Sails, MD, Last Rate: 20 mL/hr at 04/23/18 1008, 1,000 mL at 04/23/18 1008  Medications Prior to Admission  Medication Sig Dispense Refill Last Dose  . albuterol (PROVENTIL HFA;VENTOLIN HFA) 108 (90 BASE) MCG/ACT inhaler Inhale into the lungs every 6 (six) hours as needed for wheezing or shortness of breath.   Past Week at Unknown time  . omeprazole (PRILOSEC) 40 MG capsule Take 40 mg by mouth daily.     Marland Kitchen saccharomyces boulardii (FLORASTOR) 250 MG capsule Take 250 mg by mouth daily.     . traZODone (DESYREL) 50 MG tablet Take 50 mg by mouth at bedtime as needed for sleep.     Marland Kitchen atorvastatin (LIPITOR) 10 MG tablet Take 10 mg by mouth daily. AM   03/02/2015 at 0830  . cetirizine (ZYRTEC) 10 MG tablet Take 10 mg by mouth daily. AM   03/02/2015 at 0830  . citalopram (CELEXA) 20 MG tablet Take 20 mg by mouth daily. AM   03/02/2015 at 0830  . clobetasol cream (TEMOVATE) 0.63 % Apply 1 application topically 3 (three) times a week. As needed   Past Week at Unknown time  . cyanocobalamin 500 MCG tablet Take 500 mcg by mouth daily. AM   03/02/2015 at 0830  . Fluticasone-Salmeterol (ADVAIR)  250-50 MCG/DOSE AEPB Inhale 1 puff into the lungs 2 (two) times daily.   03/03/2015 at 0600  . mometasone (NASONEX) 50 MCG/ACT nasal spray Place 2 sprays into the nose daily as needed.   02/28/2015 at Unknown time  . Probiotic Product (PROBIOTIC DAILY PO) Take by mouth. AM   Past Week at Unknown time  . triamterene-hydrochlorothiazide (MAXZIDE-25) 37.5-25 MG per tablet Take 1 tablet by mouth daily. AM   04/23/2018 at 0730     Allergies  Allergen Reactions  . Amitiza [Lubiprostone] Itching     Past Medical History:  Diagnosis Date  . Asthma   . Cancer ([place])    skin ca  . Carpal tunnel syndrome    right  . Chronic leukopenia   . Cough    from asthma and GERD  . Depression   . Environmental and seasonal allergies   . GERD (gastroesophageal reflux disease)   . Headache    from allergies  . Hypercholesteremia   . Hypertension   . Nephrolithiasis   . Shortness of breath dyspnea    when asthma flares    Review of systems:      Physical Exam    Heart and lungs: Regular rate and rhythm without rub or gallop, lungs are bilaterally clear.    HEENT: Normocephalic atraumatic eyes are anicteric    Other:    Pertinant exam for procedure: Soft  nontender nondistended bowel sounds positive normoactive    Planned proceedures: EGD, colonoscopy and indicated procedures. I have discussed the risks benefits and complications of procedures to include not limited to bleeding, infection, perforation and the risk of sedation and the patient wishes to proceed.    Lollie Sails, MD Gastroenterology 04/23/2018  10:29 AM

## 2018-04-23 NOTE — Op Note (Addendum)
[facility]enter[facility] Patient Name: Diamond Odonnell Procedure Date: 04/23/2018 10:16 AM MRN: [id number] Account #: [id number] Date of Birth: 1946/04/27 Admit Type: Outpatient Age: 72 Room: [facility] ENDO ROOM 3 Gender: Female Note Status: Finalized Procedure:            Upper GI endoscopy Indications:          Dyspepsia, Dysphagia, Diarrhea Providers:            Lollie Sails, MD Referring MD:         Christena Flake. Raechel Ache, MD (Referring MD) Medicines:            Monitored Anesthesia Care Complications:        No immediate complications. Procedure:            Pre-Anesthesia Assessment:                       - ASA Grade Assessment: II - A patient with mild                        systemic disease.                       After obtaining informed consent, the endoscope was                        passed under direct vision. Throughout the procedure,                        the patient's blood pressure, pulse, and oxygen                        saturations were monitored continuously. The Endoscope                        was introduced through the mouth, and advanced to the                        third part of duodenum. The upper GI endoscopy was                        accomplished without difficulty. The patient tolerated                        the procedure well. Findings:      Abnormal motility was noted in the middle third of the esophagus and in       the lower third of the esophagus. The cricopharyngeus was normal. There       is a decrease in motility of the esophageal body. Tertiary peristaltic       waves are noted.      The Z-line was irregular. Biopsies were taken with a cold forceps for       histology. Of note, there is no inflammation, ring or web throughout the       esophagus.      Diffuse and patchy minimal inflammation characterized by congestion       (edema) and erythema was found in the gastric body. Biopsies were taken       with a cold forceps for  histology.      Evidence of a Nissen fundoplication was found on retroflexion in the  cardia. The wrap appeared healthy/intact.      The cardia and gastric fundus were normal on retroflexion otherwise.      The examined duodenum was normal. Biopsies were taken with a cold       forceps for histology. Impression:           - Abnormal esophageal motility.                       - Z-line irregular. Biopsied.                       - Bile gastritis. Biopsied.                       - A Nissen fundoplication was found. The wrap appears                        intact.                       - Normal examined duodenum. Biopsied. Recommendation:       - Discharge patient to home.                       - Take prilosec/omeprazole 30--45 minutes before a meal                        as discussed. Procedure Code(s):    --- Professional ---                       [phone], Esophagogastroduodenoscopy, flexible, transoral;                        with biopsy, single or multiple Diagnosis Code(s):    --- Professional ---                       K22.4, Dyskinesia of esophagus                       K22.8, Other specified diseases of esophagus                       K29.60, Other gastritis without bleeding                       Z98.890, Other specified postprocedural states                       R10.13, Epigastric pain                       R13.10, Dysphagia, unspecified                       R19.7, Diarrhea, unspecified CPT copyright 2018 [employer]. All rights reserved. The codes documented in this report are preliminary and upon coder review may  be revised to meet current compliance requirements. Lollie Sails, MD 04/23/2018 11:05:23 AM This report has been signed electronically. Number of Addenda: 0 Note Initiated On: 04/23/2018 10:16 AM      [facility]

## 2018-04-24 LAB — SURGICAL PATHOLOGY

## 2018-04-27 ENCOUNTER — Encounter: Payer: Self-pay | Admitting: Gastroenterology

## 2019-04-22 IMAGING — US US PELVIS COMPLETE TRANSABD/TRANSVAG
1 series · 13 of 25 positions shown · non-contrast
Comparison: None in PACs

CLINICAL DATA: One year history of bilateral lower abdominal pain.
The patient has history of irritable bowel disease. The patient is
postmenopausal.



[Series 1: us pelvis complete transabd/transvag · 0.21mm/px · 13 of 66 slices shown]
[im 1/66]
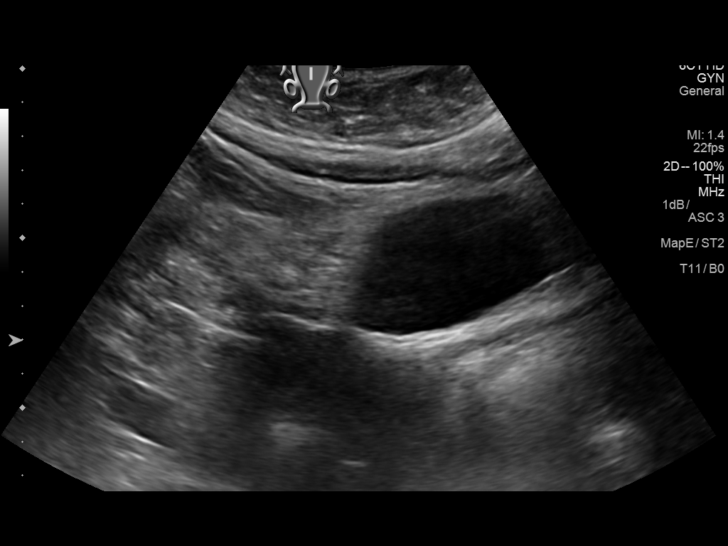
[im 6/66]
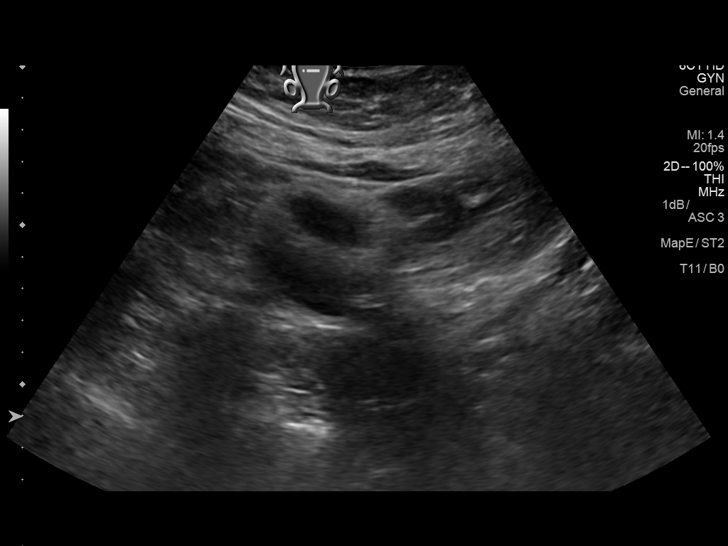
[im 11/66]
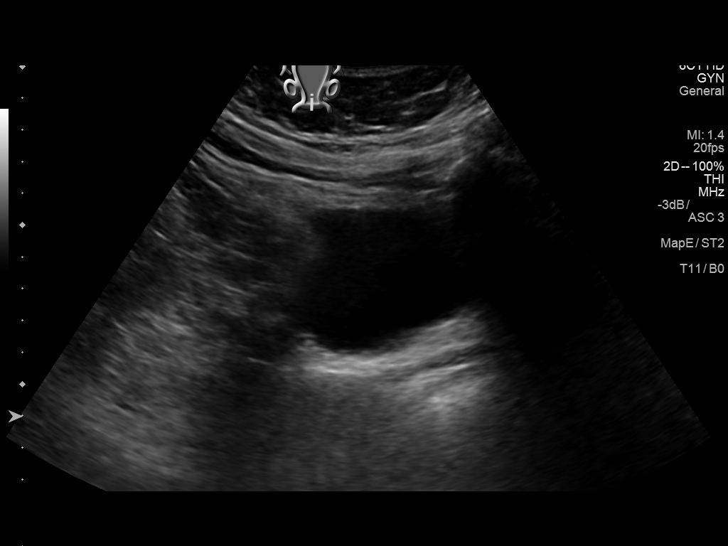
[im 17/66]
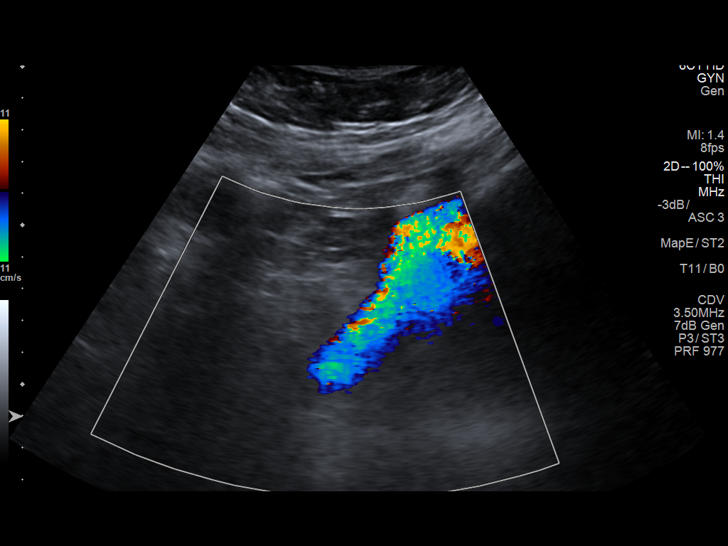
[im 22/66]
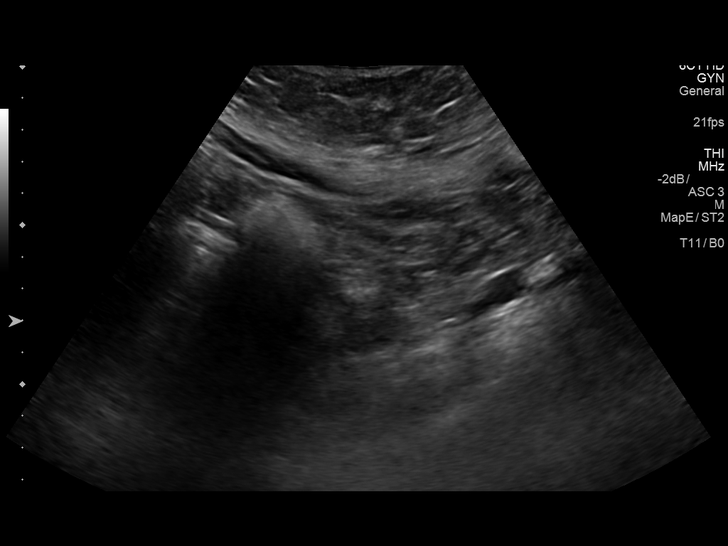
[im 28/66]
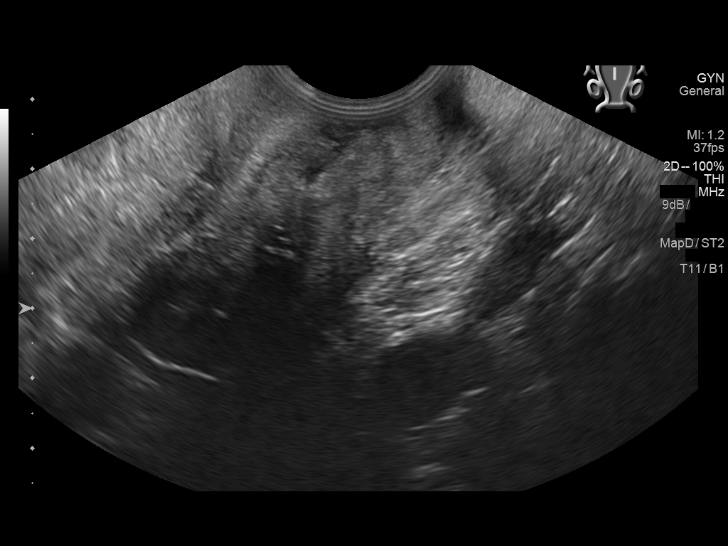
[im 33/66]
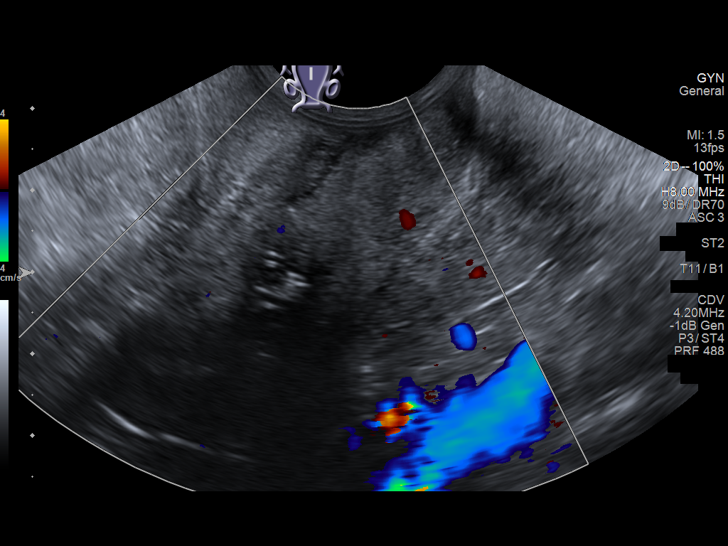
[im 38/66]
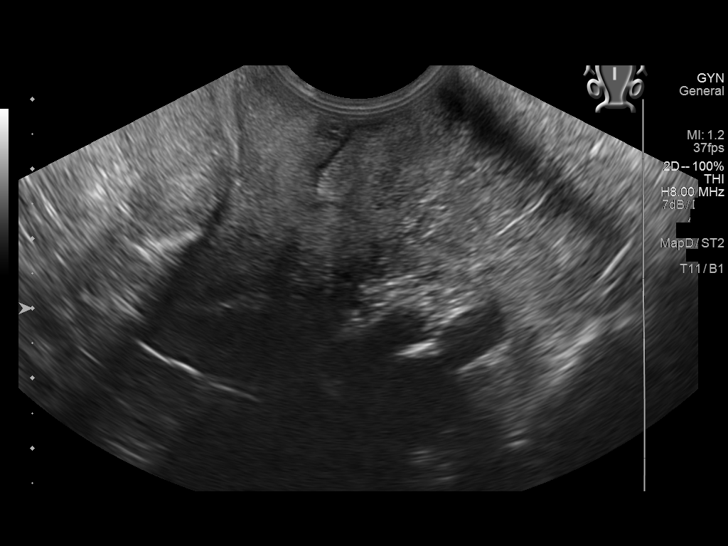
[im 44/66]
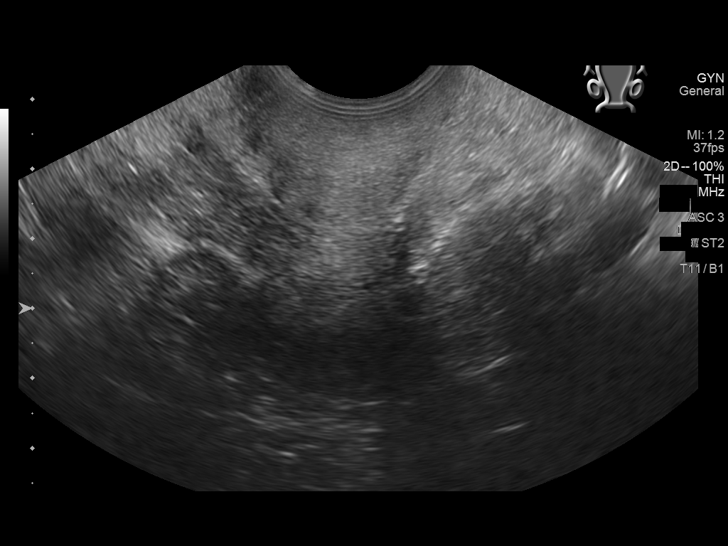
[im 49/66]
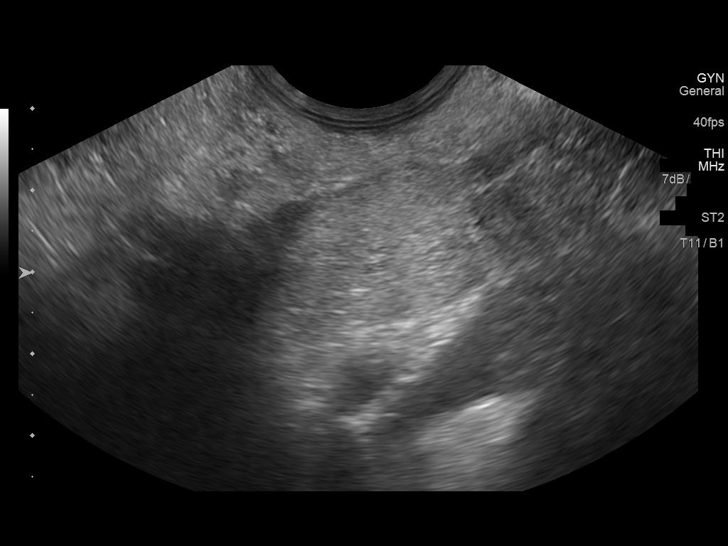
[im 55/66]
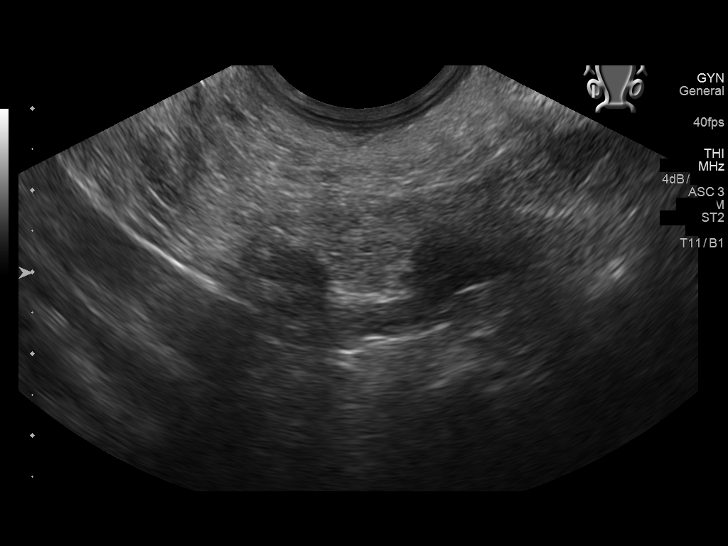
[im 60/66]
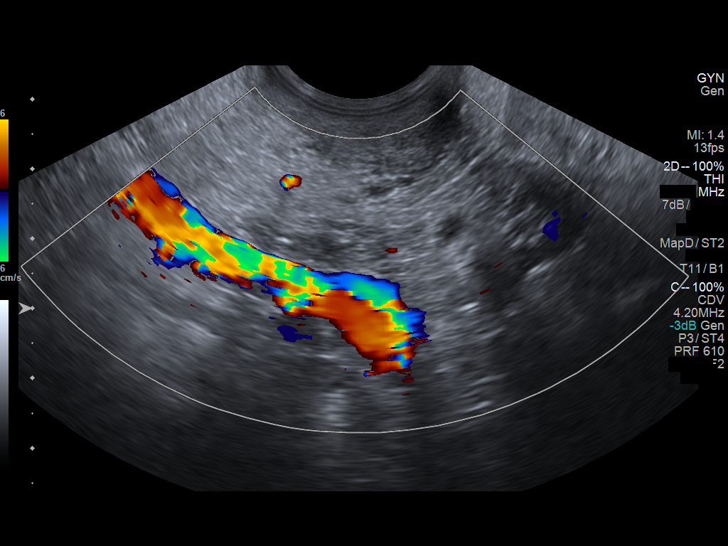
[im 66/66]
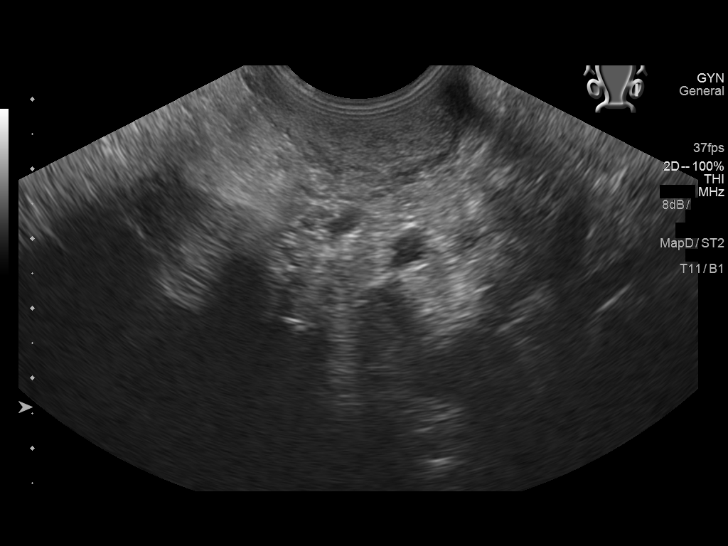

[13 of 25 positions shown; findings below may reference images not displayed]

FINDINGS: Uterus

Measurements: 6.1 x 2.8 x 4.0 cm.. No fibroids are visualized. There
is a 1.0 x 0.8 x 1.3 cm structure in the inferior aspect of the
uterine cavity which may reflect a nabothian cyst. It contain
septations..

Endometrium

Thickness: 2.2 mm.  No focal abnormality visualized.

Right ovary

The right ovary was not visualized.

Left ovary

The left ovary was not visualized.

Other findings

No abnormal free fluid. After the transvaginal portion of the study
slight vaginal bleeding occurred. There was no prior history of
vaginal bleeding. The patient tolerated the transvaginal portion of
the study without complaint and reported no pain after the exam.
IMPRESSION: Probable septated nabothian cyst in the inferior aspect of the
uterus at the junction with the cervix. However, a small amount of
painless vaginal bleeding was observed immediately after the
transvaginal portion exam. While this may be due to atrophic
vaginitis, given the finding in the upper aspect of the cervix, in
the setting of post-menopausal bleeding, endometrial sampling is
indicated to exclude carcinoma. If results are benign,
sonohysterogram should be considered for focal lesion work-up prior
to hysteroscopy. (Ref: Radiological Reasoning: Algorithmic Workup of
Abnormal Vaginal Bleeding with Endovaginal Sonography and
Sonohysterography. AJR [id number]; 191:S68-73)

Nonvisualization of the ovaries. No adnexal masses or free fluid are
observed.

## 2019-08-13 ENCOUNTER — Other Ambulatory Visit: Payer: Self-pay | Admitting: Internal Medicine

## 2019-08-13 DIAGNOSIS — Z1231 Encounter for screening mammogram for malignant neoplasm of breast: Secondary | ICD-10-CM

## 2020-01-30 ENCOUNTER — Ambulatory Visit
Admission: RE | Admit: 2020-01-30 | Discharge: 2020-01-30 | Disposition: A | Discharge status: Home / Self Care | Payer: Medicare PPO | Source: Ambulatory Visit | Attending: Internal Medicine | Admitting: Internal Medicine

## 2020-01-30 ENCOUNTER — Encounter (INDEPENDENT_AMBULATORY_CARE_PROVIDER_SITE_OTHER): Payer: Self-pay

## 2020-01-30 ENCOUNTER — Other Ambulatory Visit: Payer: Self-pay

## 2020-01-30 DIAGNOSIS — Z1231 Encounter for screening mammogram for malignant neoplasm of breast: Secondary | ICD-10-CM | POA: Diagnosis not present

## 2021-10-14 ENCOUNTER — Other Ambulatory Visit: Payer: Self-pay | Admitting: Gerontology

## 2021-10-14 DIAGNOSIS — Z1231 Encounter for screening mammogram for malignant neoplasm of breast: Secondary | ICD-10-CM

## 2022-08-10 DIAGNOSIS — M47896 Other spondylosis, lumbar region: Secondary | ICD-10-CM | POA: Diagnosis not present

## 2022-08-10 DIAGNOSIS — M542 Cervicalgia: Secondary | ICD-10-CM | POA: Diagnosis not present

## 2022-08-10 DIAGNOSIS — M47816 Spondylosis without myelopathy or radiculopathy, lumbar region: Secondary | ICD-10-CM | POA: Diagnosis not present

## 2022-08-10 DIAGNOSIS — M47812 Spondylosis without myelopathy or radiculopathy, cervical region: Secondary | ICD-10-CM | POA: Diagnosis not present

## 2022-08-10 DIAGNOSIS — M545 Low back pain, unspecified: Secondary | ICD-10-CM | POA: Diagnosis not present

## 2022-08-26 DIAGNOSIS — M47812 Spondylosis without myelopathy or radiculopathy, cervical region: Secondary | ICD-10-CM | POA: Diagnosis not present

## 2022-09-16 DIAGNOSIS — M5459 Other low back pain: Secondary | ICD-10-CM | POA: Diagnosis not present

## 2022-09-16 DIAGNOSIS — M542 Cervicalgia: Secondary | ICD-10-CM | POA: Diagnosis not present

## 2022-09-21 DIAGNOSIS — M5459 Other low back pain: Secondary | ICD-10-CM | POA: Diagnosis not present

## 2022-09-21 DIAGNOSIS — M542 Cervicalgia: Secondary | ICD-10-CM | POA: Diagnosis not present

## 2022-10-10 DIAGNOSIS — M5412 Radiculopathy, cervical region: Secondary | ICD-10-CM | POA: Diagnosis not present

## 2022-10-10 DIAGNOSIS — M4712 Other spondylosis with myelopathy, cervical region: Secondary | ICD-10-CM | POA: Diagnosis not present

## 2022-10-11 DIAGNOSIS — M542 Cervicalgia: Secondary | ICD-10-CM | POA: Diagnosis not present

## 2022-10-11 DIAGNOSIS — M5459 Other low back pain: Secondary | ICD-10-CM | POA: Diagnosis not present

## 2022-10-13 DIAGNOSIS — M4712 Other spondylosis with myelopathy, cervical region: Secondary | ICD-10-CM | POA: Diagnosis not present

## 2022-10-20 ENCOUNTER — Other Ambulatory Visit: Payer: Self-pay | Admitting: Gerontology

## 2022-10-20 DIAGNOSIS — E7800 Pure hypercholesterolemia, unspecified: Secondary | ICD-10-CM | POA: Diagnosis not present

## 2022-10-20 DIAGNOSIS — E538 Deficiency of other specified B group vitamins: Secondary | ICD-10-CM | POA: Diagnosis not present

## 2022-10-20 DIAGNOSIS — I129 Hypertensive chronic kidney disease with stage 1 through stage 4 chronic kidney disease, or unspecified chronic kidney disease: Secondary | ICD-10-CM | POA: Diagnosis not present

## 2022-10-20 DIAGNOSIS — F3342 Major depressive disorder, recurrent, in full remission: Secondary | ICD-10-CM | POA: Diagnosis not present

## 2022-10-20 DIAGNOSIS — Z1231 Encounter for screening mammogram for malignant neoplasm of breast: Secondary | ICD-10-CM

## 2022-10-20 DIAGNOSIS — J309 Allergic rhinitis, unspecified: Secondary | ICD-10-CM | POA: Diagnosis not present

## 2022-10-20 DIAGNOSIS — J4540 Moderate persistent asthma, uncomplicated: Secondary | ICD-10-CM | POA: Diagnosis not present

## 2022-10-20 DIAGNOSIS — Z0000 Encounter for general adult medical examination without abnormal findings: Secondary | ICD-10-CM | POA: Diagnosis not present

## 2022-10-20 DIAGNOSIS — F338 Other recurrent depressive disorders: Secondary | ICD-10-CM | POA: Diagnosis not present

## 2022-10-20 DIAGNOSIS — D72819 Decreased white blood cell count, unspecified: Secondary | ICD-10-CM | POA: Diagnosis not present

## 2022-10-20 DIAGNOSIS — N182 Chronic kidney disease, stage 2 (mild): Secondary | ICD-10-CM | POA: Diagnosis not present

## 2022-10-20 DIAGNOSIS — E669 Obesity, unspecified: Secondary | ICD-10-CM | POA: Diagnosis not present

## 2022-10-20 DIAGNOSIS — I10 Essential (primary) hypertension: Secondary | ICD-10-CM | POA: Diagnosis not present

## 2022-10-20 DIAGNOSIS — R7303 Prediabetes: Secondary | ICD-10-CM | POA: Diagnosis not present

## 2022-10-24 DIAGNOSIS — M5412 Radiculopathy, cervical region: Secondary | ICD-10-CM | POA: Diagnosis not present

## 2022-10-28 DIAGNOSIS — M5412 Radiculopathy, cervical region: Secondary | ICD-10-CM | POA: Diagnosis not present

## 2022-11-14 DIAGNOSIS — M47896 Other spondylosis, lumbar region: Secondary | ICD-10-CM | POA: Diagnosis not present

## 2022-11-14 DIAGNOSIS — M5412 Radiculopathy, cervical region: Secondary | ICD-10-CM | POA: Diagnosis not present

## 2022-11-14 DIAGNOSIS — M542 Cervicalgia: Secondary | ICD-10-CM | POA: Diagnosis not present

## 2022-11-17 DIAGNOSIS — M47896 Other spondylosis, lumbar region: Secondary | ICD-10-CM | POA: Diagnosis not present

## 2022-11-23 DIAGNOSIS — M47896 Other spondylosis, lumbar region: Secondary | ICD-10-CM | POA: Diagnosis not present

## 2022-12-02 DIAGNOSIS — M5412 Radiculopathy, cervical region: Secondary | ICD-10-CM | POA: Diagnosis not present

## 2022-12-15 DIAGNOSIS — M47817 Spondylosis without myelopathy or radiculopathy, lumbosacral region: Secondary | ICD-10-CM | POA: Diagnosis not present

## 2022-12-28 DIAGNOSIS — M47817 Spondylosis without myelopathy or radiculopathy, lumbosacral region: Secondary | ICD-10-CM | POA: Diagnosis not present

## 2023-01-11 DIAGNOSIS — M47817 Spondylosis without myelopathy or radiculopathy, lumbosacral region: Secondary | ICD-10-CM | POA: Diagnosis not present

## 2023-01-12 DIAGNOSIS — G894 Chronic pain syndrome: Secondary | ICD-10-CM | POA: Diagnosis not present

## 2023-01-12 DIAGNOSIS — M47817 Spondylosis without myelopathy or radiculopathy, lumbosacral region: Secondary | ICD-10-CM | POA: Diagnosis not present

## 2023-01-25 DIAGNOSIS — M47817 Spondylosis without myelopathy or radiculopathy, lumbosacral region: Secondary | ICD-10-CM | POA: Diagnosis not present

## 2023-02-23 DIAGNOSIS — M47817 Spondylosis without myelopathy or radiculopathy, lumbosacral region: Secondary | ICD-10-CM | POA: Diagnosis not present

## 2023-02-23 DIAGNOSIS — M25562 Pain in left knee: Secondary | ICD-10-CM | POA: Diagnosis not present

## 2023-02-23 DIAGNOSIS — M25561 Pain in right knee: Secondary | ICD-10-CM | POA: Diagnosis not present

## 2023-02-23 DIAGNOSIS — G894 Chronic pain syndrome: Secondary | ICD-10-CM | POA: Diagnosis not present

## 2023-03-08 DIAGNOSIS — K580 Irritable bowel syndrome with diarrhea: Secondary | ICD-10-CM | POA: Diagnosis not present

## 2023-04-04 ENCOUNTER — Ambulatory Visit
Admission: RE | Admit: 2023-04-04 | Discharge: 2023-04-04 | Disposition: A | Discharge status: Home / Self Care | Payer: Medicare PPO | Source: Ambulatory Visit | Attending: Gerontology | Admitting: Gerontology

## 2023-04-04 DIAGNOSIS — Z1231 Encounter for screening mammogram for malignant neoplasm of breast: Secondary | ICD-10-CM | POA: Diagnosis not present

## 2023-04-17 DIAGNOSIS — Z23 Encounter for immunization: Secondary | ICD-10-CM | POA: Diagnosis not present

## 2023-04-17 DIAGNOSIS — K2100 Gastro-esophageal reflux disease with esophagitis, without bleeding: Secondary | ICD-10-CM | POA: Diagnosis not present

## 2023-04-17 DIAGNOSIS — J4540 Moderate persistent asthma, uncomplicated: Secondary | ICD-10-CM | POA: Diagnosis not present

## 2023-04-17 DIAGNOSIS — F338 Other recurrent depressive disorders: Secondary | ICD-10-CM | POA: Diagnosis not present

## 2023-04-17 DIAGNOSIS — Z09 Encounter for follow-up examination after completed treatment for conditions other than malignant neoplasm: Secondary | ICD-10-CM | POA: Diagnosis not present

## 2023-04-17 DIAGNOSIS — F3342 Major depressive disorder, recurrent, in full remission: Secondary | ICD-10-CM | POA: Diagnosis not present

## 2023-04-17 DIAGNOSIS — E7800 Pure hypercholesterolemia, unspecified: Secondary | ICD-10-CM | POA: Diagnosis not present

## 2023-04-17 DIAGNOSIS — E669 Obesity, unspecified: Secondary | ICD-10-CM | POA: Diagnosis not present

## 2023-04-17 DIAGNOSIS — I10 Essential (primary) hypertension: Secondary | ICD-10-CM | POA: Diagnosis not present

## 2023-04-17 DIAGNOSIS — I129 Hypertensive chronic kidney disease with stage 1 through stage 4 chronic kidney disease, or unspecified chronic kidney disease: Secondary | ICD-10-CM | POA: Diagnosis not present

## 2023-04-17 DIAGNOSIS — J309 Allergic rhinitis, unspecified: Secondary | ICD-10-CM | POA: Diagnosis not present

## 2023-05-15 DIAGNOSIS — G4733 Obstructive sleep apnea (adult) (pediatric): Secondary | ICD-10-CM | POA: Diagnosis not present

## 2023-05-23 DIAGNOSIS — G4733 Obstructive sleep apnea (adult) (pediatric): Secondary | ICD-10-CM | POA: Diagnosis not present

## 2023-06-12 DIAGNOSIS — G4733 Obstructive sleep apnea (adult) (pediatric): Secondary | ICD-10-CM | POA: Diagnosis not present

## 2023-08-03 DIAGNOSIS — M25561 Pain in right knee: Secondary | ICD-10-CM | POA: Diagnosis not present

## 2023-08-03 DIAGNOSIS — M25661 Stiffness of right knee, not elsewhere classified: Secondary | ICD-10-CM | POA: Diagnosis not present

## 2023-08-03 DIAGNOSIS — R262 Difficulty in walking, not elsewhere classified: Secondary | ICD-10-CM | POA: Diagnosis not present

## 2023-08-03 DIAGNOSIS — M1711 Unilateral primary osteoarthritis, right knee: Secondary | ICD-10-CM | POA: Diagnosis not present

## 2023-08-10 DIAGNOSIS — M25661 Stiffness of right knee, not elsewhere classified: Secondary | ICD-10-CM | POA: Diagnosis not present

## 2023-08-10 DIAGNOSIS — R262 Difficulty in walking, not elsewhere classified: Secondary | ICD-10-CM | POA: Diagnosis not present

## 2023-08-10 DIAGNOSIS — M25561 Pain in right knee: Secondary | ICD-10-CM | POA: Diagnosis not present

## 2023-08-10 DIAGNOSIS — M1711 Unilateral primary osteoarthritis, right knee: Secondary | ICD-10-CM | POA: Diagnosis not present

## 2023-08-22 DIAGNOSIS — M25561 Pain in right knee: Secondary | ICD-10-CM | POA: Diagnosis not present

## 2023-08-22 DIAGNOSIS — M1711 Unilateral primary osteoarthritis, right knee: Secondary | ICD-10-CM | POA: Diagnosis not present

## 2023-08-22 DIAGNOSIS — M25661 Stiffness of right knee, not elsewhere classified: Secondary | ICD-10-CM | POA: Diagnosis not present

## 2023-08-22 DIAGNOSIS — R262 Difficulty in walking, not elsewhere classified: Secondary | ICD-10-CM | POA: Diagnosis not present

## 2023-08-30 DIAGNOSIS — M25561 Pain in right knee: Secondary | ICD-10-CM | POA: Diagnosis not present

## 2023-08-30 DIAGNOSIS — M25661 Stiffness of right knee, not elsewhere classified: Secondary | ICD-10-CM | POA: Diagnosis not present

## 2023-08-30 DIAGNOSIS — M1711 Unilateral primary osteoarthritis, right knee: Secondary | ICD-10-CM | POA: Diagnosis not present

## 2023-08-30 DIAGNOSIS — R262 Difficulty in walking, not elsewhere classified: Secondary | ICD-10-CM | POA: Diagnosis not present

## 2023-09-06 DIAGNOSIS — R262 Difficulty in walking, not elsewhere classified: Secondary | ICD-10-CM | POA: Diagnosis not present

## 2023-09-06 DIAGNOSIS — M25661 Stiffness of right knee, not elsewhere classified: Secondary | ICD-10-CM | POA: Diagnosis not present

## 2023-09-06 DIAGNOSIS — M1711 Unilateral primary osteoarthritis, right knee: Secondary | ICD-10-CM | POA: Diagnosis not present

## 2023-09-06 DIAGNOSIS — M25561 Pain in right knee: Secondary | ICD-10-CM | POA: Diagnosis not present

## 2023-09-07 DIAGNOSIS — K589 Irritable bowel syndrome without diarrhea: Secondary | ICD-10-CM | POA: Diagnosis not present

## 2023-09-13 DIAGNOSIS — M1711 Unilateral primary osteoarthritis, right knee: Secondary | ICD-10-CM | POA: Diagnosis not present

## 2023-09-13 DIAGNOSIS — M25661 Stiffness of right knee, not elsewhere classified: Secondary | ICD-10-CM | POA: Diagnosis not present

## 2023-09-13 DIAGNOSIS — M25561 Pain in right knee: Secondary | ICD-10-CM | POA: Diagnosis not present

## 2023-09-13 DIAGNOSIS — R262 Difficulty in walking, not elsewhere classified: Secondary | ICD-10-CM | POA: Diagnosis not present

## 2023-09-20 DIAGNOSIS — M1711 Unilateral primary osteoarthritis, right knee: Secondary | ICD-10-CM | POA: Diagnosis not present

## 2023-09-20 DIAGNOSIS — M25561 Pain in right knee: Secondary | ICD-10-CM | POA: Diagnosis not present

## 2023-09-20 DIAGNOSIS — M25661 Stiffness of right knee, not elsewhere classified: Secondary | ICD-10-CM | POA: Diagnosis not present

## 2023-09-20 DIAGNOSIS — R262 Difficulty in walking, not elsewhere classified: Secondary | ICD-10-CM | POA: Diagnosis not present

## 2023-10-18 DIAGNOSIS — E669 Obesity, unspecified: Secondary | ICD-10-CM | POA: Diagnosis not present

## 2023-10-18 DIAGNOSIS — J4540 Moderate persistent asthma, uncomplicated: Secondary | ICD-10-CM | POA: Diagnosis not present

## 2023-10-18 DIAGNOSIS — Z1331 Encounter for screening for depression: Secondary | ICD-10-CM | POA: Diagnosis not present

## 2023-10-18 DIAGNOSIS — E538 Deficiency of other specified B group vitamins: Secondary | ICD-10-CM | POA: Diagnosis not present

## 2023-10-18 DIAGNOSIS — E7800 Pure hypercholesterolemia, unspecified: Secondary | ICD-10-CM | POA: Diagnosis not present

## 2023-10-18 DIAGNOSIS — I10 Essential (primary) hypertension: Secondary | ICD-10-CM | POA: Diagnosis not present

## 2023-10-18 DIAGNOSIS — Z79899 Other long term (current) drug therapy: Secondary | ICD-10-CM | POA: Diagnosis not present

## 2023-10-18 DIAGNOSIS — E66811 Obesity, class 1: Secondary | ICD-10-CM | POA: Diagnosis not present

## 2023-10-18 DIAGNOSIS — N182 Chronic kidney disease, stage 2 (mild): Secondary | ICD-10-CM | POA: Diagnosis not present

## 2023-10-18 DIAGNOSIS — F3342 Major depressive disorder, recurrent, in full remission: Secondary | ICD-10-CM | POA: Diagnosis not present

## 2023-10-18 DIAGNOSIS — J309 Allergic rhinitis, unspecified: Secondary | ICD-10-CM | POA: Diagnosis not present

## 2023-10-18 DIAGNOSIS — R7303 Prediabetes: Secondary | ICD-10-CM | POA: Diagnosis not present

## 2023-10-18 DIAGNOSIS — K580 Irritable bowel syndrome with diarrhea: Secondary | ICD-10-CM | POA: Diagnosis not present

## 2023-10-18 DIAGNOSIS — I129 Hypertensive chronic kidney disease with stage 1 through stage 4 chronic kidney disease, or unspecified chronic kidney disease: Secondary | ICD-10-CM | POA: Diagnosis not present

## 2023-10-18 DIAGNOSIS — R197 Diarrhea, unspecified: Secondary | ICD-10-CM | POA: Diagnosis not present

## 2023-10-18 DIAGNOSIS — Z0000 Encounter for general adult medical examination without abnormal findings: Secondary | ICD-10-CM | POA: Diagnosis not present

## 2023-12-01 DIAGNOSIS — Z961 Presence of intraocular lens: Secondary | ICD-10-CM | POA: Diagnosis not present

## 2023-12-01 DIAGNOSIS — H43391 Other vitreous opacities, right eye: Secondary | ICD-10-CM | POA: Diagnosis not present

## 2023-12-01 DIAGNOSIS — H1789 Other corneal scars and opacities: Secondary | ICD-10-CM | POA: Diagnosis not present

## 2023-12-01 DIAGNOSIS — H26491 Other secondary cataract, right eye: Secondary | ICD-10-CM | POA: Diagnosis not present

## 2024-02-13 DIAGNOSIS — G8929 Other chronic pain: Secondary | ICD-10-CM | POA: Diagnosis not present

## 2024-02-13 DIAGNOSIS — M1711 Unilateral primary osteoarthritis, right knee: Secondary | ICD-10-CM | POA: Diagnosis not present

## 2024-03-11 DIAGNOSIS — K582 Mixed irritable bowel syndrome: Secondary | ICD-10-CM | POA: Diagnosis not present

## 2024-03-19 DIAGNOSIS — G8929 Other chronic pain: Secondary | ICD-10-CM | POA: Diagnosis not present

## 2024-03-19 DIAGNOSIS — M1711 Unilateral primary osteoarthritis, right knee: Secondary | ICD-10-CM | POA: Diagnosis not present

## 2024-03-21 DIAGNOSIS — M25661 Stiffness of right knee, not elsewhere classified: Secondary | ICD-10-CM | POA: Diagnosis not present

## 2024-03-21 DIAGNOSIS — M25561 Pain in right knee: Secondary | ICD-10-CM | POA: Diagnosis not present

## 2024-03-21 DIAGNOSIS — M1711 Unilateral primary osteoarthritis, right knee: Secondary | ICD-10-CM | POA: Diagnosis not present

## 2024-03-21 DIAGNOSIS — R262 Difficulty in walking, not elsewhere classified: Secondary | ICD-10-CM | POA: Diagnosis not present

## 2024-03-28 DIAGNOSIS — M25561 Pain in right knee: Secondary | ICD-10-CM | POA: Diagnosis not present

## 2024-03-28 DIAGNOSIS — M25661 Stiffness of right knee, not elsewhere classified: Secondary | ICD-10-CM | POA: Diagnosis not present

## 2024-03-28 DIAGNOSIS — M1711 Unilateral primary osteoarthritis, right knee: Secondary | ICD-10-CM | POA: Diagnosis not present

## 2024-03-28 DIAGNOSIS — R262 Difficulty in walking, not elsewhere classified: Secondary | ICD-10-CM | POA: Diagnosis not present

## 2024-04-11 DIAGNOSIS — M25561 Pain in right knee: Secondary | ICD-10-CM | POA: Diagnosis not present

## 2024-04-11 DIAGNOSIS — M25661 Stiffness of right knee, not elsewhere classified: Secondary | ICD-10-CM | POA: Diagnosis not present

## 2024-04-11 DIAGNOSIS — R262 Difficulty in walking, not elsewhere classified: Secondary | ICD-10-CM | POA: Diagnosis not present

## 2024-04-11 DIAGNOSIS — M1711 Unilateral primary osteoarthritis, right knee: Secondary | ICD-10-CM | POA: Diagnosis not present

## 2024-04-18 DIAGNOSIS — M1711 Unilateral primary osteoarthritis, right knee: Secondary | ICD-10-CM | POA: Diagnosis not present

## 2024-04-18 DIAGNOSIS — G8929 Other chronic pain: Secondary | ICD-10-CM | POA: Diagnosis not present

## 2024-04-24 DIAGNOSIS — J4540 Moderate persistent asthma, uncomplicated: Secondary | ICD-10-CM | POA: Diagnosis not present

## 2024-04-24 DIAGNOSIS — Z09 Encounter for follow-up examination after completed treatment for conditions other than malignant neoplasm: Secondary | ICD-10-CM | POA: Diagnosis not present

## 2024-04-24 DIAGNOSIS — F3342 Major depressive disorder, recurrent, in full remission: Secondary | ICD-10-CM | POA: Diagnosis not present

## 2024-04-24 DIAGNOSIS — Z1331 Encounter for screening for depression: Secondary | ICD-10-CM | POA: Diagnosis not present

## 2024-04-24 DIAGNOSIS — I10 Essential (primary) hypertension: Secondary | ICD-10-CM | POA: Diagnosis not present

## 2024-04-24 DIAGNOSIS — J309 Allergic rhinitis, unspecified: Secondary | ICD-10-CM | POA: Diagnosis not present

## 2024-04-24 DIAGNOSIS — F338 Other recurrent depressive disorders: Secondary | ICD-10-CM | POA: Diagnosis not present

## 2024-04-24 DIAGNOSIS — Z23 Encounter for immunization: Secondary | ICD-10-CM | POA: Diagnosis not present

## 2024-04-24 DIAGNOSIS — E7800 Pure hypercholesterolemia, unspecified: Secondary | ICD-10-CM | POA: Diagnosis not present

## 2024-04-24 DIAGNOSIS — I1310 Hypertensive heart and chronic kidney disease without heart failure, with stage 1 through stage 4 chronic kidney disease, or unspecified chronic kidney disease: Secondary | ICD-10-CM | POA: Diagnosis not present
